# Patient Record
Sex: Male | Born: 1950 | Race: White | Hispanic: No | Marital: Single | Smoking: Current every day smoker
Health system: Southern US, Community
[De-identification: ages and names within clinical notes are randomized; demographics above are authoritative.]

## PROBLEM LIST (undated history)

## (undated) DIAGNOSIS — G20A1 Parkinson's disease without dyskinesia, without mention of fluctuations: Secondary | ICD-10-CM

## (undated) DIAGNOSIS — M1990 Unspecified osteoarthritis, unspecified site: Secondary | ICD-10-CM

## (undated) DIAGNOSIS — E7800 Pure hypercholesterolemia, unspecified: Secondary | ICD-10-CM

## (undated) DIAGNOSIS — F70 Mild intellectual disabilities: Secondary | ICD-10-CM

## (undated) DIAGNOSIS — K219 Gastro-esophageal reflux disease without esophagitis: Secondary | ICD-10-CM

## (undated) DIAGNOSIS — J449 Chronic obstructive pulmonary disease, unspecified: Secondary | ICD-10-CM

## (undated) DIAGNOSIS — I10 Essential (primary) hypertension: Secondary | ICD-10-CM

## (undated) DIAGNOSIS — G20 Parkinson's disease: Secondary | ICD-10-CM

## (undated) DIAGNOSIS — G43909 Migraine, unspecified, not intractable, without status migrainosus: Secondary | ICD-10-CM

## (undated) HISTORY — DX: Unspecified osteoarthritis, unspecified site: M19.90

## (undated) HISTORY — PX: APPENDECTOMY: SHX54

## (undated) HISTORY — DX: Chronic obstructive pulmonary disease, unspecified: J44.9

## (undated) HISTORY — DX: Migraine, unspecified, not intractable, without status migrainosus: G43.909

## (undated) HISTORY — DX: Gastro-esophageal reflux disease without esophagitis: K21.9

## (undated) HISTORY — DX: Essential (primary) hypertension: I10

## (undated) HISTORY — PX: VEIN LIGATION AND STRIPPING: SHX2653

## (undated) HISTORY — DX: Pure hypercholesterolemia, unspecified: E78.00

## (undated) HISTORY — DX: Mild intellectual disabilities: F70

## (undated) HISTORY — DX: Parkinson's disease: G20

## (undated) HISTORY — DX: Parkinson's disease without dyskinesia, without mention of fluctuations: G20.A1

---

## 1998-05-28 ENCOUNTER — Inpatient Hospital Stay (HOSPITAL_COMMUNITY): Admission: AD | Admit: 1998-05-28 | Discharge: 1998-05-30 | Payer: Self-pay | Admitting: *Deleted

## 1998-09-16 ENCOUNTER — Inpatient Hospital Stay (HOSPITAL_COMMUNITY): Admission: AD | Admit: 1998-09-16 | Discharge: 1998-09-19 | Payer: Self-pay | Admitting: *Deleted

## 1999-01-19 ENCOUNTER — Inpatient Hospital Stay (HOSPITAL_COMMUNITY): Admission: EM | Admit: 1999-01-19 | Discharge: 1999-01-23 | Payer: Self-pay | Admitting: *Deleted

## 1999-05-10 ENCOUNTER — Inpatient Hospital Stay (HOSPITAL_COMMUNITY): Admission: AD | Admit: 1999-05-10 | Discharge: 1999-05-20 | Payer: Self-pay

## 1999-08-07 ENCOUNTER — Emergency Department (HOSPITAL_COMMUNITY): Admission: EM | Admit: 1999-08-07 | Discharge: 1999-08-07 | Payer: Self-pay | Admitting: Emergency Medicine

## 2000-02-09 ENCOUNTER — Inpatient Hospital Stay (HOSPITAL_COMMUNITY): Admission: EM | Admit: 2000-02-09 | Discharge: 2000-02-12 | Payer: Self-pay | Admitting: *Deleted

## 2000-02-11 ENCOUNTER — Encounter: Payer: Self-pay | Admitting: *Deleted

## 2000-06-21 ENCOUNTER — Emergency Department (HOSPITAL_COMMUNITY): Admission: EM | Admit: 2000-06-21 | Discharge: 2000-06-21 | Payer: Self-pay | Admitting: Emergency Medicine

## 2000-06-28 ENCOUNTER — Emergency Department (HOSPITAL_COMMUNITY): Admission: EM | Admit: 2000-06-28 | Discharge: 2000-06-28 | Payer: Self-pay | Admitting: Emergency Medicine

## 2000-07-04 ENCOUNTER — Emergency Department (HOSPITAL_COMMUNITY): Admission: EM | Admit: 2000-07-04 | Discharge: 2000-07-04 | Payer: Self-pay | Admitting: Internal Medicine

## 2000-07-26 ENCOUNTER — Emergency Department (HOSPITAL_COMMUNITY): Admission: EM | Admit: 2000-07-26 | Discharge: 2000-07-26 | Payer: Self-pay | Admitting: Emergency Medicine

## 2000-08-05 ENCOUNTER — Emergency Department (HOSPITAL_COMMUNITY): Admission: EM | Admit: 2000-08-05 | Discharge: 2000-08-05 | Payer: Self-pay | Admitting: Internal Medicine

## 2000-09-23 ENCOUNTER — Emergency Department (HOSPITAL_COMMUNITY): Admission: EM | Admit: 2000-09-23 | Discharge: 2000-09-23 | Payer: Self-pay | Admitting: *Deleted

## 2000-09-30 ENCOUNTER — Encounter: Payer: Self-pay | Admitting: Emergency Medicine

## 2000-09-30 ENCOUNTER — Emergency Department (HOSPITAL_COMMUNITY): Admission: EM | Admit: 2000-09-30 | Discharge: 2000-09-30 | Payer: Self-pay | Admitting: Emergency Medicine

## 2000-11-01 ENCOUNTER — Emergency Department (HOSPITAL_COMMUNITY): Admission: EM | Admit: 2000-11-01 | Discharge: 2000-11-01 | Payer: Self-pay | Admitting: *Deleted

## 2000-11-02 ENCOUNTER — Emergency Department (HOSPITAL_COMMUNITY): Admission: EM | Admit: 2000-11-02 | Discharge: 2000-11-02 | Payer: Self-pay | Admitting: *Deleted

## 2000-12-20 ENCOUNTER — Emergency Department (HOSPITAL_COMMUNITY): Admission: EM | Admit: 2000-12-20 | Discharge: 2000-12-20 | Payer: Self-pay | Admitting: Internal Medicine

## 2001-02-19 ENCOUNTER — Emergency Department (HOSPITAL_COMMUNITY): Admission: EM | Admit: 2001-02-19 | Discharge: 2001-02-19 | Payer: Self-pay | Admitting: *Deleted

## 2001-02-23 ENCOUNTER — Emergency Department (HOSPITAL_COMMUNITY): Admission: EM | Admit: 2001-02-23 | Discharge: 2001-02-23 | Payer: Self-pay | Admitting: *Deleted

## 2001-02-23 ENCOUNTER — Encounter: Payer: Self-pay | Admitting: *Deleted

## 2001-03-16 ENCOUNTER — Emergency Department (HOSPITAL_COMMUNITY): Admission: EM | Admit: 2001-03-16 | Discharge: 2001-03-16 | Payer: Self-pay | Admitting: Emergency Medicine

## 2001-05-09 ENCOUNTER — Emergency Department (HOSPITAL_COMMUNITY): Admission: EM | Admit: 2001-05-09 | Discharge: 2001-05-09 | Payer: Self-pay | Admitting: *Deleted

## 2001-05-15 ENCOUNTER — Encounter: Payer: Self-pay | Admitting: *Deleted

## 2001-05-15 ENCOUNTER — Emergency Department (HOSPITAL_COMMUNITY): Admission: EM | Admit: 2001-05-15 | Discharge: 2001-05-15 | Payer: Self-pay | Admitting: *Deleted

## 2001-08-01 ENCOUNTER — Emergency Department (HOSPITAL_COMMUNITY): Admission: EM | Admit: 2001-08-01 | Discharge: 2001-08-01 | Payer: Self-pay | Admitting: Emergency Medicine

## 2001-09-21 ENCOUNTER — Emergency Department (HOSPITAL_COMMUNITY): Admission: EM | Admit: 2001-09-21 | Discharge: 2001-09-21 | Payer: Self-pay | Admitting: *Deleted

## 2001-10-05 ENCOUNTER — Emergency Department (HOSPITAL_COMMUNITY): Admission: EM | Admit: 2001-10-05 | Discharge: 2001-10-05 | Payer: Self-pay | Admitting: Emergency Medicine

## 2001-11-16 ENCOUNTER — Emergency Department (HOSPITAL_COMMUNITY): Admission: EM | Admit: 2001-11-16 | Discharge: 2001-11-16 | Payer: Self-pay | Admitting: Emergency Medicine

## 2002-01-05 ENCOUNTER — Encounter: Payer: Self-pay | Admitting: Emergency Medicine

## 2002-01-05 ENCOUNTER — Emergency Department (HOSPITAL_COMMUNITY): Admission: EM | Admit: 2002-01-05 | Discharge: 2002-01-05 | Payer: Self-pay | Admitting: Emergency Medicine

## 2002-02-15 ENCOUNTER — Emergency Department (HOSPITAL_COMMUNITY): Admission: EM | Admit: 2002-02-15 | Discharge: 2002-02-15 | Payer: Self-pay | Admitting: Emergency Medicine

## 2002-05-18 ENCOUNTER — Emergency Department (HOSPITAL_COMMUNITY): Admission: EM | Admit: 2002-05-18 | Discharge: 2002-05-18 | Payer: Self-pay | Admitting: *Deleted

## 2002-08-28 ENCOUNTER — Emergency Department (HOSPITAL_COMMUNITY): Admission: EM | Admit: 2002-08-28 | Discharge: 2002-08-28 | Payer: Self-pay | Admitting: Emergency Medicine

## 2002-11-13 ENCOUNTER — Emergency Department (HOSPITAL_COMMUNITY): Admission: EM | Admit: 2002-11-13 | Discharge: 2002-11-13 | Payer: Self-pay | Admitting: *Deleted

## 2002-12-07 ENCOUNTER — Emergency Department (HOSPITAL_COMMUNITY): Admission: EM | Admit: 2002-12-07 | Discharge: 2002-12-07 | Payer: Self-pay | Admitting: Internal Medicine

## 2003-03-15 ENCOUNTER — Emergency Department (HOSPITAL_COMMUNITY): Admission: EM | Admit: 2003-03-15 | Discharge: 2003-03-15 | Payer: Self-pay | Admitting: Emergency Medicine

## 2003-05-21 ENCOUNTER — Inpatient Hospital Stay (HOSPITAL_COMMUNITY): Admission: AD | Admit: 2003-05-21 | Discharge: 2003-05-23 | Payer: Self-pay | Admitting: Psychiatry

## 2003-05-21 ENCOUNTER — Emergency Department (HOSPITAL_COMMUNITY): Admission: EM | Admit: 2003-05-21 | Discharge: 2003-05-21 | Payer: Self-pay | Admitting: Emergency Medicine

## 2003-06-06 ENCOUNTER — Emergency Department (HOSPITAL_COMMUNITY): Admission: EM | Admit: 2003-06-06 | Discharge: 2003-06-06 | Payer: Self-pay | Admitting: Emergency Medicine

## 2003-06-10 ENCOUNTER — Emergency Department (HOSPITAL_COMMUNITY): Admission: EM | Admit: 2003-06-10 | Discharge: 2003-06-10 | Payer: Self-pay | Admitting: *Deleted

## 2003-08-13 ENCOUNTER — Emergency Department (HOSPITAL_COMMUNITY): Admission: EM | Admit: 2003-08-13 | Discharge: 2003-08-13 | Payer: Self-pay | Admitting: Emergency Medicine

## 2003-08-28 ENCOUNTER — Emergency Department (HOSPITAL_COMMUNITY): Admission: EM | Admit: 2003-08-28 | Discharge: 2003-08-28 | Payer: Self-pay | Admitting: Emergency Medicine

## 2003-10-10 ENCOUNTER — Emergency Department (HOSPITAL_COMMUNITY): Admission: EM | Admit: 2003-10-10 | Discharge: 2003-10-10 | Payer: Self-pay | Admitting: Emergency Medicine

## 2003-12-27 ENCOUNTER — Emergency Department (HOSPITAL_COMMUNITY): Admission: EM | Admit: 2003-12-27 | Discharge: 2003-12-27 | Payer: Self-pay | Admitting: Emergency Medicine

## 2004-04-07 ENCOUNTER — Emergency Department (HOSPITAL_COMMUNITY): Admission: EM | Admit: 2004-04-07 | Discharge: 2004-04-07 | Payer: Self-pay | Admitting: Emergency Medicine

## 2004-04-22 ENCOUNTER — Emergency Department (HOSPITAL_COMMUNITY): Admission: EM | Admit: 2004-04-22 | Discharge: 2004-04-22 | Payer: Self-pay | Admitting: Emergency Medicine

## 2004-07-24 ENCOUNTER — Emergency Department (HOSPITAL_COMMUNITY): Admission: EM | Admit: 2004-07-24 | Discharge: 2004-07-24 | Payer: Self-pay | Admitting: Emergency Medicine

## 2006-02-06 ENCOUNTER — Emergency Department (HOSPITAL_COMMUNITY): Admission: EM | Admit: 2006-02-06 | Discharge: 2006-02-07 | Payer: Self-pay | Admitting: Emergency Medicine

## 2006-08-05 ENCOUNTER — Emergency Department (HOSPITAL_COMMUNITY): Admission: EM | Admit: 2006-08-05 | Discharge: 2006-08-05 | Payer: Self-pay | Admitting: Emergency Medicine

## 2006-08-08 ENCOUNTER — Emergency Department (HOSPITAL_COMMUNITY): Admission: EM | Admit: 2006-08-08 | Discharge: 2006-08-09 | Payer: Self-pay | Admitting: Emergency Medicine

## 2007-02-11 ENCOUNTER — Emergency Department (HOSPITAL_COMMUNITY): Admission: EM | Admit: 2007-02-11 | Discharge: 2007-02-11 | Payer: Self-pay | Admitting: Emergency Medicine

## 2007-08-17 ENCOUNTER — Inpatient Hospital Stay (HOSPITAL_COMMUNITY): Admission: EM | Admit: 2007-08-17 | Discharge: 2007-08-19 | Payer: Self-pay | Admitting: Emergency Medicine

## 2007-11-01 ENCOUNTER — Emergency Department (HOSPITAL_COMMUNITY): Admission: EM | Admit: 2007-11-01 | Discharge: 2007-11-01 | Payer: Self-pay | Admitting: Emergency Medicine

## 2008-02-24 ENCOUNTER — Ambulatory Visit (HOSPITAL_COMMUNITY): Admission: RE | Admit: 2008-02-24 | Discharge: 2008-02-24 | Payer: Self-pay | Admitting: Family Medicine

## 2008-04-24 ENCOUNTER — Emergency Department (HOSPITAL_COMMUNITY): Admission: EM | Admit: 2008-04-24 | Discharge: 2008-04-24 | Payer: Self-pay | Admitting: Emergency Medicine

## 2008-12-28 ENCOUNTER — Emergency Department (HOSPITAL_COMMUNITY): Admission: EM | Admit: 2008-12-28 | Discharge: 2008-12-28 | Payer: Self-pay | Admitting: Emergency Medicine

## 2009-01-27 ENCOUNTER — Emergency Department (HOSPITAL_COMMUNITY): Admission: EM | Admit: 2009-01-27 | Discharge: 2009-01-27 | Payer: Self-pay | Admitting: Emergency Medicine

## 2009-08-01 ENCOUNTER — Emergency Department (HOSPITAL_COMMUNITY): Admission: EM | Admit: 2009-08-01 | Discharge: 2009-08-01 | Payer: Self-pay | Admitting: Emergency Medicine

## 2009-10-03 ENCOUNTER — Emergency Department (HOSPITAL_COMMUNITY): Admission: EM | Admit: 2009-10-03 | Discharge: 2009-10-04 | Payer: Self-pay | Admitting: Emergency Medicine

## 2009-11-02 ENCOUNTER — Emergency Department (HOSPITAL_COMMUNITY): Admission: EM | Admit: 2009-11-02 | Discharge: 2009-11-02 | Payer: Self-pay | Admitting: Emergency Medicine

## 2010-02-23 ENCOUNTER — Emergency Department (HOSPITAL_COMMUNITY)
Admission: EM | Admit: 2010-02-23 | Discharge: 2010-02-23 | Payer: Self-pay | Source: Home / Self Care | Admitting: Emergency Medicine

## 2010-02-23 LAB — POCT I-STAT, CHEM 8
Calcium, Ion: 1.15 mmol/L (ref 1.12–1.32)
Creatinine, Ser: 0.8 mg/dL (ref 0.4–1.5)
HCT: 45 % (ref 39.0–52.0)
Hemoglobin: 15.3 g/dL (ref 13.0–17.0)
Potassium: 4.6 mEq/L (ref 3.5–5.1)
Sodium: 138 mEq/L (ref 135–145)
TCO2: 25 mmol/L (ref 0–100)

## 2010-02-23 LAB — CBC
MCH: 34.3 pg — ABNORMAL HIGH (ref 26.0–34.0)
MCHC: 36.2 g/dL — ABNORMAL HIGH (ref 30.0–36.0)
MCV: 94.7 fL (ref 78.0–100.0)
Platelets: 192 10*3/uL (ref 150–400)

## 2010-02-23 LAB — DIFFERENTIAL
Basophils Relative: 1 % (ref 0–1)
Eosinophils Absolute: 0.2 10*3/uL (ref 0.0–0.7)
Eosinophils Relative: 2 % (ref 0–5)
Lymphocytes Relative: 19 % (ref 12–46)
Lymphs Abs: 1.6 10*3/uL (ref 0.7–4.0)
Monocytes Absolute: 0.7 10*3/uL (ref 0.1–1.0)
Monocytes Relative: 8 % (ref 3–12)
Neutro Abs: 6.1 10*3/uL (ref 1.7–7.7)

## 2010-02-23 LAB — POCT CARDIAC MARKERS: Myoglobin, poc: 134 ng/mL (ref 12–200)

## 2010-04-11 LAB — RAPID URINE DRUG SCREEN, HOSP PERFORMED
Barbiturates: NOT DETECTED
Cocaine: NOT DETECTED
Tetrahydrocannabinol: NOT DETECTED

## 2010-04-11 LAB — DIFFERENTIAL
Basophils Absolute: 0 10*3/uL (ref 0.0–0.1)
Basophils Absolute: 0.1 10*3/uL (ref 0.0–0.1)
Basophils Relative: 1 % (ref 0–1)
Eosinophils Relative: 2 % (ref 0–5)
Lymphocytes Relative: 23 % (ref 12–46)
Monocytes Absolute: 0.7 10*3/uL (ref 0.1–1.0)
Monocytes Relative: 8 % (ref 3–12)
Neutro Abs: 5.6 10*3/uL (ref 1.7–7.7)
Neutro Abs: 7.4 10*3/uL (ref 1.7–7.7)
Neutrophils Relative %: 66 % (ref 43–77)

## 2010-04-11 LAB — GLUCOSE, CAPILLARY
Glucose-Capillary: 210 mg/dL — ABNORMAL HIGH (ref 70–99)
Glucose-Capillary: 233 mg/dL — ABNORMAL HIGH (ref 70–99)
Glucose-Capillary: 285 mg/dL — ABNORMAL HIGH (ref 70–99)

## 2010-04-11 LAB — COMPREHENSIVE METABOLIC PANEL
ALT: 20 U/L (ref 0–53)
AST: 20 U/L (ref 0–37)
Albumin: 4 g/dL (ref 3.5–5.2)
Alkaline Phosphatase: 93 U/L (ref 39–117)
Chloride: 104 mEq/L (ref 96–112)
GFR calc Af Amer: >60 mL/min (ref >60)
Potassium: 4.5 mEq/L (ref 3.5–5.1)
Sodium: 135 mEq/L (ref 135–145)
Total Bilirubin: 0.7 mg/dL (ref 0.3–1.2)
Total Protein: 7.3 g/dL (ref 6.0–8.3)

## 2010-04-11 LAB — CBC
HCT: 42.8 % (ref 39.0–52.0)
HCT: 46.2 % (ref 39.0–52.0)
Hemoglobin: 15.8 g/dL (ref 13.0–17.0)
MCV: 97.7 fL (ref 78.0–100.0)
Platelets: 221 10*3/uL (ref 150–400)
RDW: 12.8 % (ref 11.5–15.5)
RDW: 13.2 % (ref 11.5–15.5)
WBC: 8.5 10*3/uL (ref 4.0–10.5)
WBC: 9.4 10*3/uL (ref 4.0–10.5)

## 2010-04-11 LAB — URINALYSIS, ROUTINE W REFLEX MICROSCOPIC
Glucose, UA: 100 mg/dL — AB
Hgb urine dipstick: NEGATIVE
Hgb urine dipstick: NEGATIVE
Leukocytes, UA: NEGATIVE
Protein, ur: NEGATIVE mg/dL
Protein, ur: NEGATIVE mg/dL
Specific Gravity, Urine: 1.015 (ref 1.005–1.030)
Urobilinogen, UA: 0.2 mg/dL (ref 0.0–1.0)
pH: 7 (ref 5.0–8.0)

## 2010-04-11 LAB — BASIC METABOLIC PANEL
BUN: 10 mg/dL (ref 6–23)
Creatinine, Ser: 0.7 mg/dL (ref 0.4–1.5)
GFR calc non Af Amer: >60 mL/min (ref >60)
Potassium: 3.9 mEq/L (ref 3.5–5.1)

## 2010-04-11 LAB — CK TOTAL AND CKMB (NOT AT ARMC)
CK, MB: 12.6 ng/mL (ref 0.3–4.0)
Relative Index: 5 — ABNORMAL HIGH (ref 0.0–2.5)

## 2010-04-11 LAB — URINE MICROSCOPIC-ADD ON

## 2010-04-11 LAB — APTT: aPTT: 27 seconds (ref 24–37)

## 2010-04-11 LAB — ETHANOL: Alcohol, Ethyl (B): <5 mg/dL (ref 0–10)

## 2010-04-14 LAB — COMPREHENSIVE METABOLIC PANEL
AST: 19 U/L (ref 0–37)
Albumin: 3.7 g/dL (ref 3.5–5.2)
BUN: 13 mg/dL (ref 6–23)
Calcium: 9.1 mg/dL (ref 8.4–10.5)
Creatinine, Ser: 0.8 mg/dL (ref 0.4–1.5)
GFR calc Af Amer: >60 mL/min (ref >60)
GFR calc non Af Amer: >60 mL/min (ref >60)

## 2010-04-14 LAB — DIFFERENTIAL
Eosinophils Relative: 1 % (ref 0–5)
Lymphocytes Relative: 13 % (ref 12–46)
Lymphs Abs: 1.3 10*3/uL (ref 0.7–4.0)
Monocytes Absolute: 0.7 10*3/uL (ref 0.1–1.0)
Neutro Abs: 7.3 10*3/uL (ref 1.7–7.7)

## 2010-04-14 LAB — URINALYSIS, ROUTINE W REFLEX MICROSCOPIC
Glucose, UA: >1000 mg/dL — AB
Ketones, ur: NEGATIVE mg/dL
Leukocytes, UA: NEGATIVE
Nitrite: NEGATIVE
Specific Gravity, Urine: 1.015 (ref 1.005–1.030)
pH: 6 (ref 5.0–8.0)

## 2010-04-14 LAB — CBC
MCH: 33.6 pg (ref 26.0–34.0)
MCHC: 35 g/dL (ref 30.0–36.0)
Platelets: 206 10*3/uL (ref 150–400)

## 2010-04-14 LAB — GLUCOSE, CAPILLARY: Glucose-Capillary: 252 mg/dL — ABNORMAL HIGH (ref 70–99)

## 2010-04-30 LAB — URINALYSIS, ROUTINE W REFLEX MICROSCOPIC
Hgb urine dipstick: NEGATIVE
Protein, ur: NEGATIVE mg/dL
Specific Gravity, Urine: 1.01 (ref 1.005–1.030)
Urobilinogen, UA: 0.2 mg/dL (ref 0.0–1.0)

## 2010-05-09 LAB — DIFFERENTIAL
Basophils Absolute: 0 10*3/uL (ref 0.0–0.1)
Basophils Relative: 0 % (ref 0–1)
Eosinophils Relative: 1 % (ref 0–5)
Lymphocytes Relative: 12 % (ref 12–46)
Monocytes Absolute: 0.7 10*3/uL (ref 0.1–1.0)

## 2010-05-09 LAB — CBC
HCT: 45.7 % (ref 39.0–52.0)
Hemoglobin: 16.2 g/dL (ref 13.0–17.0)
MCHC: 35.4 g/dL (ref 30.0–36.0)
RDW: 12.9 % (ref 11.5–15.5)

## 2010-05-09 LAB — BASIC METABOLIC PANEL
CO2: 28 mEq/L (ref 19–32)
Chloride: 101 mEq/L (ref 96–112)
Glucose, Bld: 193 mg/dL — ABNORMAL HIGH (ref 70–99)
Potassium: 4.3 mEq/L (ref 3.5–5.1)
Sodium: 133 mEq/L — ABNORMAL LOW (ref 135–145)

## 2010-06-11 NOTE — Discharge Summary (Signed)
Gerald Cruz, Gerald Cruz                 ACCOUNT NO.:  [id number]   MEDICAL RECORD NO.:  [id number]          PATIENT TYPE:  INP   LOCATION:  A327                          FACILITY:  [facility]   PHYSICIAN:  Scott A. Gerda Diss, MD    DATE OF BIRTH:  1950-03-11   DATE OF ADMISSION:  08/17/2007  DATE OF DISCHARGE:  07/23/2009LH                               DISCHARGE SUMMARY   DISCHARGE DIAGNOSIS:  Syncope.   The patient states passing out spells.  Over the past few weeks, he has  had some syncopal episodes.  He does not check his sugars much.  His A1c  recently had been 5.9, which indicates possibility of too tight control.  Given his mental health limitations, we will back-off on some of his  medication and at this point in time I have recommended the glyburide be  stopped.  In addition to this, we would decrease the Actos at 30 mg each  morning.  His cardiac enzymes while in the hospital, it should be noted  that initial cardiac panel on August 17, 2007, was normal and then on August 17, 2007, later that day the MB was 9.3, creatinine kinase 235, and  troponin was 0.01, and then the following day his MB was 11.4, but  troponin was less than 0.01.  Also, it should be noted that Cardiology  consultation was asked for but then due to some sort of glitch it did  not get completed in.  It should also be noted that he had EKG on August 18, 2007, which showed no acute changes and the patient has not had any  chest pains or passing out spells since being in the hospital and he  quite emphatically demands to go home and states he is going to sign out  AMA if we do not release him right now and there was no reasoning with  him.  I felt he was capable of making this decision, yet also it would  probably be best if he was able to stay.  On the chest exam, he does  have a little bit of chest congestion.  We are going to give him a dose  of antibiotics, but then we are also going to recommend that he follow  up in  the office early next week and also discharge him to home.   HOME MEDICATIONS:  1. Haloperidol 10 mg daily.  2. Pindolol 5 mg b.i.d.  3. Lithium 300 mg t.i.d.  4. Trazodone 100 mg daily.  5. Benztropine 1 mg b.i.d.  6. Abilify 15 mg daily.  7. Lovastatin 20 mg daily.  8. Gemfibrozil 600 mg b.i.d.  9. Metformin 850 mg b.i.d.  10.Actos 30 mg p.o. daily.  11.Lantus 10 units at bedtime.  12.Nizatidine 150 mg daily.  13.Mentax 1 tablet daily.  14.Levaquin 500 mg daily for 7 days.   Follow up with Dr. Brett Canales on Monday on August 23, 2007, and follow up  sooner if any particular problems to report back to the ED and to eat  all meals and not to skip meals.  I think the syncope was related to  hypoglycemia.  I really doubt heart disease.  He certainly has a lot of  risk factors, although he is not willing to stay or go through any  further tests.      Scott A. Gerda Diss, MD  Electronically Signed     SAL/MEDQ  D:  08/19/2007  T:  08/19/2007  Job:  [id number]

## 2010-06-11 NOTE — H&P (Signed)
Gerald Cruz, CINA                 ACCOUNT NO.:  [id number]   MEDICAL RECORD NO.:  [id number]          PATIENT TYPE:  INP   LOCATION:  A327                          FACILITY:  [facility]   PHYSICIAN:  Donna Bernard, M.D.DATE OF BIRTH:  1950-06-14   DATE OF ADMISSION:  08/17/2007  DATE OF DISCHARGE:  LH                              HISTORY & PHYSICAL   CHIEF COMPLAINT:  Passing out spells and chest spasms.   SUBJECTIVE:  This patient is a 60 year old white male with history of  type 2 diabetes now insulin-requiring, hyperlipidemia, chronic smoking,  asthma and chronic psychiatric difficulties including A prior diagnosis  of schizophrenia and potential mental retardation, who presents to the  hospital the day of admission with acute concerns.  On further history  late this morning, the patient was found sweaty and a bit short of  breath.  He passed out.  He claims no significant injury from having  fallen.  On further history, off and on for the past few weeks he has  had several other syncopal episodes.  The patient does not really check  his sugars very much.  They are checked on occasion at the home where he  stays.  These were unavailable.  The patient also notes chest spasm  intermittently he describes as an achiness, comes, lasts a few minutes  and then fades away.  There is strong family history coronary artery  disease on his mother's side.   The patient claims compliance with his medications, which include:  1. Metformin 850 mg one p.o. b.i.d.  2. Lopid 600 mg one p.o. b.i.d.  3. Benztropine 1 mg b.i.d.  4. Trazodone 100 mg p.o. q.h.s.  5. Lithium carbonate 300 mg t.i.d.  6. Pindolol 5 mg b.i.d.  7. Haldol 10 mg q.h.s.  8. Abilify 15 mg q.a.m.  9. Actos 45 mg each morning.  10.Mevacor 20 mg p.o. q.h.s.  11.Glyburide which is administered 2.5 q.i.d. at his home.  12.Prilosec 20 mg daily.  13.Lantus 10 units q.h.s.   PAST SURGERIES:  Remote vein stripping.  The patient  has declined  colonoscopy the past.   SOCIAL:  Patient is single, lives in group home.  Smokes a couple packs  per day.  No alcohol use.  No children.   FAMILY HISTORY:  Positive for coronary artery disease as noted and also  diabetes and hypertension.   REVIEW OF SYSTEMS:  Otherwise negative.   PHYSICAL EXAM:  BP 138/86.  Patient is alert, no acute distress.  Significant obesity noted.  HEENT: Normal.  LUNGS:  Rare wheeze bilaterally.  HEART:  Normal rate and rhythm.  No significant murmurs.  ABDOMEN:  Nontender.  EXTREMITIES:  Trace edema, ankles.  No significant rash.   SIGNIFICANT LABS:  EKG:  Normal sinus rhythm.  No significant ST-T  changes.  Cardiac enzymes negative. Met-7, CBC good mild elevation of  glucose, currently 140.   IMPRESSION:  1. Multiple syncopal events, likely hypoglycemia with the patient's      spotty intake at times, the fact he does not control his own  sugars.  He also has schizophrenia history with an element of      mental retardation, which makes him less likely to listen to      signals of hypoglycemia.  I am sure he skips meals.  He is well-      meaning but this is a real problem.  2. Chest spasm, nonspecific; however, with intermittent syncopal      events along with many other risk factors need to consider possible      cardiac etiology.  3. Type 2 diabetes.  A1c really too tight in light of most recent      circumstances.  A1c was 5.6 back in the spring.  Going to hold off      on glyburide since it is a frequent culprit in hypoglycemia.  4. Hyperlipidemia.  5. Chronic smoking.  6. Asthma.  7. Hypertension, stable.   PLAN:  As per orders.      Donna Bernard, M.D.  Electronically Signed     WSL/MEDQ  D:  08/17/2007  T:  08/18/2007  Job:  [id number]

## 2010-06-14 NOTE — Op Note (Signed)
NAMECHERON, Cruz                 ACCOUNT NO.:  [id number]   MEDICAL RECORD NO.:  [id number]          PATIENT TYPE:  AMB   LOCATION:  DAY                           FACILITY:  [facility]   PHYSICIAN:  R. Roetta Sessions, M.D. DATE OF BIRTH:  [birth year]   DATE OF PROCEDURE:  04/03/2004  DATE OF DISCHARGE:                                 OPERATIVE REPORT   PROCEDURE PERFORMED:  Colonoscopy.   INDICATIONS FOR PROCEDURE:  [name] is a 60 year old gentleman sent around  through courtesy of Dr. Lilyan Punt for colon cancer screening via  colonoscopy.   He was seen in the outpatient holding area. He was quite agitated and irate  and stated he was not going to get a needle and was not going to have the  procedure.  He told me he had rights and was not interested in having a  colonoscopy for any reason under any circumstances.  He put his clothes back  on and left the hospital.      RMR/MEDQ  D:  04/03/2004  T:  04/03/2004  Job:  [id number]   cc:   Lorin Picket A. Gerda Diss, MD  [address]  [place]  [place] [address]  Fax: [phone]

## 2010-06-14 NOTE — Discharge Summary (Signed)
[facility]  Patient:    Gerald Cruz, Gerald Cruz                        MRN: [id number] Adm. Date:  [id number] Disc. Date: [id number] Attending:  Jasmine Pang CC:         [facility]  Attn: M. Pickett   Discharge Summary  REASON FOR ADMISSION:  Patient was a 60 year old Caucasian male from [place], [place] with a history of paranoid schizophrenia.  He had reportedly begun to hallucinate saying he was seeing dogs threatening him and devils coming out of the wall.  He became agitated when he was sent to the emergency room and was hostile and paranoid.  He has also had an altercation with a neighbor who states she is fearful of him.  For further admission information, please see psychiatric admission assessment.  PHYSICAL EXAMINATION:  Patient suffers from obesity, asthma, hypertension, and diabetes.  He also had severe varicosities.  There was some abdominal tenderness on exam also.  ADMISSION LABORATORIES:  CBC with differential was grossly within normal limits except for a slightly decreased potassium of 3.4 (3.5 to 5), glucose was slightly elevated at 125 (70-115).  He is on Glucophage to treat his diabetes.  Glycosylated hemoglobin was 5.3 (4.6 to 6.5).  Hypothyroid profile was clinically within normal limits.  Morning lithium level was 0.54 (0.8 to 1.4).  HOSPITAL COURSE:  Patient was continued on his home medication including albuterol inhaler two puffs q.i.d. p.r.n. shortness of breath due to his asthma, Haldol 10 mg q.h.s., pindolol 5 mg p.o. q.d. for hypertension, lithium 300 mg p.o. t.i.d., trazodone 100 mg p.o. q.h.s., Cogentin 1 mg p.o. b.i.d. and Glucophage 500 mg p.o. q.d. for diabetes.  Patient states primarily for a crisis admission.  He stabilized quickly and became less psychotic.  He was not agitated or violent.  There were no changes made in his medications since his case manager at the [facility] states he has done well on these.  He has a support system with his case Production designer, theatre/television/film and other assistance from the [facility].  They provided transportation for him to return home at the time of discharge.  DISCHARGE MENTAL STATUS EXAMINATION:  The patients mental status had improved.  He had fairly good eye contact.  His mood and affect were bright and positive.  He was less depressed and anxious and not irritable.  There were no suicidal or homicidal ideations.  There was no aggressive tendencies. He stated he did not intend to hurt anyone including his neighbor.  The mental health center was aware of the conflict he has had with his neighbor and will help with the situation.  He was not psychotic.  There were no auditory or visual hallucinations or delusions.  Thought processes were fairly logical and goal directed.  Cognitive exam was back to baseline.  DISCHARGE DIAGNOSES: Axis I:     1. Paranoid schizophrenia, chronic.             2. History of polysubstance abuse. Axis II:    None. Axis III:   1. Diabetes.             2. Hypertension.             3. Asthma.             4. Obesity.  5. Severe varicosities. Axis IV:    Severe. Axis V:     Global Assessment of Functioning current is 50; at admission,             Global Assessment of Functioning was 30.  DISCHARGE MEDICATIONS: 1. Haldol 10 mg p.o. q.h.s. 2. Lithium carbonate 300 mg p.o. t.i.d. 3. Trazodone 100 mg at bedtime. 4. Cogentin 1 mg b.i.d. 5. Pindolol 5 mg q.d. 6. Glucophage 500 mg p.o. q.d. 7. Albuterol inhaler p.r.n. as per the physician who is managing his asthma.  ACTIVITY LEVEL:  No major restrictions.  DIET:  To follow the ADA Diabetic Diet as described by his diabetes physician.  POSTHOSPITAL CARE PLAN:  Patient will continue to be seen at the [facility].  He has an appointment scheduled for soon after discharge.   Contact was maintained with his case manager during his hospitalization and they felt comfortable with him being discharged at this point and returning to outpatient treatment.  CONDITION ON DISCHARGE:  Improved.  PROGNOSIS:  Guarded. DD:  02/23/00 TD:  02/23/00 Job: [id number]

## 2010-06-14 NOTE — Discharge Summary (Signed)
Gerald Cruz, Gerald Cruz                           ACCOUNT NO.:  [id number]   MEDICAL RECORD NO.:  [id number]                   PATIENT TYPE:  IPS   LOCATION:  0404                                 FACILITY:  [facility]   PHYSICIAN:  Jeanice Lim, M.D.              DATE OF BIRTH:  [birth year]   DATE OF ADMISSION:  05/21/2003  DATE OF DISCHARGE:                                 DISCHARGE SUMMARY   IDENTIFYING INFORMATION:  This is a 60 year old white male who is single.  This was a voluntary admission.   HISTORY OF PRESENT ILLNESS:  This 60 year old white male with a history of  mental retardation and schizophrenia, paranoid type, presented in the  emergency department with thoughts of wanting to kill his girlfriend.  He  says that he loves her and today she called him over to where she was.  He  went over there and found her in bed with another man.  He says that he had  been teasing him that he could not have sex and that she needed to have sex  frequently.  When she got him there, she also told him that he was dumb.  He  feels embarrassed and angry.  He says that he loves her.  He went to the  emergency room because he felt upset and felt that he could not calm himself  down.  He was coherent in the emergency room, although he was upset.  He was  not acting out.  It is not clear whether he has been having auditory  hallucinations.  He apparently is compliant with his medications.  He is a  poor historian, unable to give much psychiatric or medical history.  His  speech is pressured.  He is agitated that he is here and is insistent about  leaving, although he admits that he has had thoughts of wanting to harm his  girlfriend and possibly harm himself because he feels so upset about the  incident.   PAST PSYCHIATRIC HISTORY:  The patient is followed at [facility].  This is his third admission to the [facility] with his last one being in 2002.   He has a history of  schizophrenia, paranoid-type, and mental retardation.  He has been followed  by his case manager and lives independently with case management support.  He has a history of previous admissions to [facility] and  actually says that he was in [facility] just last week.   SOCIAL HISTORY:  This is a single white male who lives alone in his own  apartment with support of his county mental health case Production designer, theatre/television/film.  Never  married.  No children.  He apparently does have some distant history in our  records of some substance abuse involving alcohol and cocaine, but his urine  drug screen was negative.  History taken  in the ER denies any current  substance abuse.  The patient does smoke approximately four packs per day of  cigarettes.   PAST MEDICAL HISTORY:  The patient's primary care Ande Therrell is not clear.  Medical problems are:  1. Diabetes mellitus.  2. Hypertension.  3. Rule out neuroleptic-induced dyskinesia.  4. Dyslipidemia.  5. GERD.   MEDICATIONS:  Medications were clarified with his pharmacy, which is  [place] at [phone].  They supply him with regular dose pack  dispensing.  Medications are:  1. Abilify 15 mg daily.  2. Actos 45 mg daily.  3. Cogentin 1 mg p.o. b.i.d.  4. Pindolol 5 mg b.i.d.  5. Haldol 10 mg p.o. q.h.s.  6. Lopid 600 mg b.i.d.  7. Glucophage 850 mg b.i.d.  8. Axid 150 mg daily.  9. Trazodone 100 mg p.o. q.h.s.  10.      Lithium 300 mg t.i.d.   DRUG ALLERGIES:  None.   POSITIVE PHYSICAL FINDINGS:  The patient's full physical exam was done in  the [facility] Emergency Room and is noted in the record.  Today he is  unwilling to cooperate with any further physical exam, although gross  findings find that his vital signs are basically within normal limits.  He  was afebrile with temperature 97.2 degrees.  His pulse was 100 at the time  of admission.  Blood pressure 130/87.  His respiratory rate was 20.   He  weight 240 pounds.  His CBG at the time of admission was 236.  That was this  morning CBG.  We were not able to get any idea of his medications until we  could call [facility] this morning.  He is now back on his regular  medications and his pharmacy indicates that he has been compliant.  He is in  no acute physical distress.  He denies any physical complaints other than  nicotine withdrawal.   MENTAL STATUS EXAMINATION:  This is a fully alert male who is agitated,  insistent that he is not willing to cooperate here.  He has been insisting  that he has to either go home or he is going to hurt the staff and take some  people out with him.  Speech is pressured and rapid.  He is hostile and  uncooperative.  He continues to perseverate about his girlfriend, although  from time to time he states he will not hurt her.  He continues to  perseverate on the incident and is clearly upset by it and admits that he  needs some time out.  His mood is agitated and angry.  Speech is pressured  and rapid.  Thought process is agitated.  Positive for suicidal thought  without a clear plan, but feeling hopeless, angry, and hurt over the  incident with his girlfriend.  Positive homicidal thoughts towards the  girlfriend since shamed him by calling him dumb.  Cognitively he is intact  and oriented x 3.  He is cognitively limited, but memory appears intact.  Impulse, control, and judgment impaired.  Insight fair.   LABORATORIES:  The patient's CBC is within normal limits with WBC 9.9,  hemoglobin 15.3, hematocrit 43.8, MCV 95.5, and platelets 292,000.  Routine  chemistries reveal normal electrolytes with a potassium of 3.7.  Glucose was  elevated at 252 as is already noted.  That was on yesterday's admission  laboratories.  BUN 5, creatinine 0.8.  The urine drug screen is negative for  all substances.  His  alcohol level was negative.  The lithium level and TSH are currently pending.   AXIS I:   1. Schizophrenia, paranoid type.  2. Distant history of polysubstance abuse.   AXIS II:  Mental retardation.   AXIS III:  1. Diabetes mellitus, type 2.  2. Hypertension.  3. Dyslipidemia.  4. Rule out neuroleptic-induced __________  5. Gastrointestinal reflux disease.   AXIS IV:  Severe conflict with his girlfriend and social stress.   AXIS V:  Global Assessment of Functioning:  Current 20, past year 50-55.   PLAN:  Voluntarily admit the patient with 15-minute checks in place with a  goal of alleviating his suicidal and homicidal thoughts.  We are going to  check a lithium level on him.  We have already obtained his medications from  the pharmacy.  This morning we are giving him Haldol 10 mg now and q.h.s. as  per his usual routine and will make available Haldol 10 mg IM or p.o. q.6h.  p.r.n. for agitation.  Ativan 2 mg IM or p.o. q.6h. p.r.n. for agitation.  We will resume his other medications per his routine.  We are planning on  transferring him to [facility] and are requesting admission  there.  Note that we have started this patient of 4 mg Nicorette gum to  alleviate his nicotine withdrawal and he has been variably compliant with  that, at times willing to do it and at other times not.   The patient is discharge with plan to transfer to [facility].     Claris Che A. Stephannie Peters                   Jeanice Lim, M.D.    MAS/MEDQ  D:  05/22/2003  T:  05/22/2003  Job:  [id number]

## 2010-06-14 NOTE — Discharge Summary (Signed)
[facility]  Patient:    Gerald Cruz, Gerald Cruz                      MRN: [id number] Adm. Date:  [id number] Disc. Date: [id number] Attending:  Esau Grew Dictator:   Valinda Hoar, N.P.                           Discharge Summary  HISTORY OF PRESENT ILLNESS:  [name] is a 60 year old white single male admitted on involuntary basis on May 10, 1999 because he says he was afraid he would hurt himself.  The patient denies everything on the involuntary commitment.  He says he did not say any of these things and he does not really know why he is here.  According to the certificate from [facility] patient said that he was hearing voices and he was afraid that he would harm himself.  He also stated that he thought people were talking about him and he was hearing voices telling him that everyone hates him and that he is no good. 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 This happened when he was 60 years old.  Patient states he is sleeping well, although the chart says he has been sleeping poorly.  Patient states he has not been eating in an effort to lose weight.  He has apparently not eaten in 2-3 days according to the patient.  Appetite decreased and he has had some weight loss.  He has decreased interest, increased energy with some paranoia and obviously some auditory hallucinations.  The patient has been to [facility] according to him "hundreds of times," [facility] x 2, [place], [place] x 2-3 times, [facility] multiple times.  He is followed by [facility] who is his legal guardian.  He states he is trying to change this because he does not like them and he feels like they are always doing something to him. Patients primary care doctor is Dr. Gerda Diss in [place], [place].  MEDICAL PROBLEMS:  Medical problems include diabetes mellitus, COPD.  He is on an 1800-calorie, ADA diet for his diabetes.  ADMISSION MEDICATIONS: 1. Glucophage 1200 mg q.d. 2. _________ 300 mg p.o. b.i.d. 3. Haldol 10 mg q.d. 4. Cogentin 1 mg q.d. 5. Albuterol inhaler two puffs q.i.d. p.r.n.  ALLERGIES:  No known drug allergies.  PHYSICAL EXAMINATION:  Please see physical exam done at [facility] on May 10, 1999.  According to physician at [facility], he was medically stable.  LABORATORY:  While he was here in the hospital, we did do a T4, T3, and TSH which were all within normal limits.  We did Lithium levels on April 16.  It was 0.40 which is low.  On April 20 it was low at 0.55.  We did a drug screen on him also and it was positive for tricylates, specifically positive for Trazodone.  Otherwise it was within normal limits.  His urinalysis was within normal limits.  HOSPITAL COURSE:  The patient was admitted to the [facility] unit and was started back on his 1800-calorie, ADA diet.  He was given Glucophage 500 mg q.d., Lithium 300 mg p.o. b.i.d., Haldol 10 mg p.o. q.d., Cogentin 1 mg p.o. q.d., albuterol inhaler two puffs  q.i.d. p.r.n.  On May 12, 1999, Dr. Janann August did order a Lithium level and increased his Risperdal to 2 mg at h.s.  While in the hospital he still complained of depression, blaming his uncle, but he cannot describe it.  It is a man.  He acknowledges with a plan but denies now.  Decreased sleep, decreased appetite, anxious, perseverates. He does go to the mental health center, and the [facility] is guardian and he does not feel like they take care of him.  They keep him depressed according to the patient.  He is paranoid delusional, hallucinating, thinking someone was going to hurt him.  He had been on Lithium and Haldol and then we decided to start him on the Risperdal.  He cannot recall being on the  Risperdal.  On the 16th he had increased agitation and anxiety.  He states he was frightened and afraid to be around people, having auditory hallucinations.  He states he does not want to say what they are saying, acknowledges that they are yelling, telling him to kill himself. Patient presents with some leg movements, drooling, no _________ movements noted.  He still feels like demons are coming out of the wall.  Patient contracts for safety.  Will consult with admitting M.D. regarding neuroleptic. Ativan 2 mg p.o. now for his agitation.  He was seen on the 17th by Dr. Janann August. He reported improvement this morning, brighter mood, decrease in hallucinations, less drooling, still anxious, restless, intrusive, and loud. He was tolerating his meds well.  We decided to increase his Risperdal, decrease his Haldol.  Lithium level was 0.4 and his Lithium was decreased and we changed this to Eskalith.  On the 18th he was seeing no hallucinations.  He reports decreased anxiety, charted five hours of sleep.  Patient reports feeling less frightened.  Speech was somewhat rapid but was relevant.  He was still intrusive at times, mood labile, appears to have increased energy, question of hypomania.  He continues with current treatment, 15 minute checks for safety.  On the 19th he was labile.  He became angry, threatening last evening.  He says he was just joking.  "This has been the best hospital I have ever been in."  He was sleeping and eating well.  Judgment is poor.  The Risperdal was increased along with the Haldol being decreased and we are going to recheck his Lithium level.  On the 20th patient was saying that he focused on not wanting mental health abuse guardian.  He denied auditory hallucinations.  He was anxious and afraid because of being discharged back to [facility].  He continued to be labile, states he is not suicidal or depressed, but states he will be if he is discharged.  Patient  appears agitated and somewhat argumentative.  On the 21st he was somewhat groggy, but again there were no suicidal ideations, no auditory or visual hallucinations.  Risperdal was decreased.  On the 22nd he had continued to do well, although he complained of increased station.  He did have an auditory hallucination telling him bad things, but he denied visual hallucinations.  On the 23rd generally he was more stable.  His mood was less labile.  He was less intrusive.  He denies current auditory hallucinations.  He slept well, feels alert today, no suicidal ideations, tolerated event well and it was decided that he could be managed on an outpatient basis in a safe manner.  CONDITION ON DISCHARGE:  The patient  is discharged in improved condition with an improvement in his mood, sleep, appetite, alleviation of suicidal or homicidal ideation, slightly sedated but he has shown some improvement in his energy.  DISPOSITION:  The patient is discharged to home and his leave is already in his [facility].  DISCHARGE MEDICATIONS: 1. Eskalith CR 450 one tab a.m., 1-1/2 at bedtime. 2. Risperdal 2 mg two tabs at bedtime. 3. Haldol 1 mg three halves at bedtime. 4. Glucophage 500 mg one tab daily. 5. Albuterol inhaler two puffs four times a day. 6. Cogentin 1 mg one tab q.a.m. 7. An antifungal powder apply to feet two times a day.  FINAL DIAGNOSES:   AXIS I:  _________ , undifferentiated type.  AXIS II:  Borderline mental retardation. AXIS III:  1. Diabetes mellitus type 2.            2. Chronic obstructive pulmonary disease.  AXIS IV:  Moderate related to problems with permanent support group and            social environment and literacy.   AXIS V:  Current global assessment of functioning 50, highest in past            year 60. DD:  06/06/99 TD:  06/10/99 Job: [id number]

## 2010-06-14 NOTE — Discharge Summary (Signed)
Gerald Cruz, Gerald Cruz                           ACCOUNT NO.:  [id number]   MEDICAL RECORD NO.:  [id number]                   PATIENT TYPE:  IPS   LOCATION:  0404                                 [facility]ACILITY[facility]   PHYSICIAN:  Jeanice Lim, M.D.              DATE OF BIRTH:  07-20-1950   DATE OF ADMISSION:  05/21/2003  DATE OF DISCHARGE:  05/23/2003                                 DISCHARGE SUMMARY   IDENTIFYING DATA:  This is a 60 year old Caucasian male, single, voluntarily  admitted with a history of MR and schizophrenia.  He presented to the  emergency room with thoughts of wanting to kill his girlfriend.  The patient  had found her in bed with another man.  She told him that she was dumb and  he was upset and felt that he could not calm down.  He had questionable  auditory hallucinations.   MEDICATIONS:  1. Abilify.  2. Actos.  3. Cogentin.  4. Pindolol.  5. Haldol.  6. Lopid.  7. Glucophage.  8. Trazodone.  9. Lithium.  10.      Axid.   DRUG ALLERGIES:  No known drug allergies.   PHYSICAL EXAMINATION:  GENERAL:  Within normal limits.  NEUROLOGIC:  Nonfocal.   LABORATORY DATA:  Routine admission labs:  Within normal limits.   MENTAL STATUS EXAM:  Fully alert, agitated, insistent that he would not  cooperate here, wanted to smoke.  He felt that he would prefer to go to [facility].  Speech was pressured, rapid.  He was agitated, angry,  perseverating on girlfriend, clearly paranoid, threatening.  He reported  suicidal ideation and homicidal ideation toward girlfriend, ashamed of being  dumb.  Cognitive: Grossly intact; borderline cognitive abilities.  Judgment and insight were quite poor.  Impulse control was also poor.   ADMISSION DIAGNOSES:   AXIS I:  Schizophrenia, paranoid type.   AXIS II:  Mental retardation.   AXIS III:  1. Diabetes mellitus type 2.  2. Hypertension.  3. Dyslipidemia.   AXIS IV:  Severe conflict with girlfriend and limited  support system.   AXIS V:  20/50-55   HOSPITAL COURSE:  The patient was admitted, ordered routine p.r.n.  medications, underwent further monitoring, and was encouraged to participate  in individual, group, and milieu therapy if tolerated.  He was placed on the  [address] Hall, placed on safety checks due to agitation.  He required 10 mg of  Haldol and Ativan IM upon admission due to threatening behavior, agitation.  The patient remained threatening, agitated, paranoid, delusional, difficult  to redirect, threatened staff as well as other patients.  [facility] was contacted to find appropriate placement for further  stabilization of this patient who could not be safely maintained within the  milieu due to acuity and mix of patients.  Therefore, the patient was  discharged with unchanged condition, still  agitated, threatening but willing  to cooperate with sheriff for transport for further stabilization in an  appropriate setting.  They were to continue medications on [facility] and arrange  followup prior to discharge after there were no safety issues.   DISCHARGE DIAGNOSES:   AXIS I:  Schizophrenia, paranoid type.   AXIS II:  Mental retardation.   AXIS III:  1. Diabetes mellitus type 2.  2. Hypertension.  3. Dyslipidemia.   AXIS IV:  Severe conflict with girlfriend and limited support system.   AXIS V:  20/50-55                                               Jeanice Lim, M.D.    JEM/MEDQ  D:  06/14/2003  T:  06/16/2003  Job:  [id number]

## 2010-06-14 NOTE — H&P (Signed)
[facility]  Patient:    Gerald Cruz, Gerald Cruz                        MRN: [id number] Adm. Date:  [id number] Attending:  Jasmine Pang CC:         [facility], Attention:  M. Pickett   Psychiatric Admission Assessment  IDENTIFYING INFORMATION:   A 60 year old Caucasian male from [place], [place] with a history of paranoid schizophrenia.  HISTORY OF PRESENT ILLNESS:  The patient states he was seeing "dogs threatening" him and "devils coming out of the walls."  He was sent to the emergency room where he stated he was also having auditory hallucinations.  He became extremely irritable, hostile and paranoid in the ER.  He has had some altercation with a neighbor who now states she is fearful of him.  PAST PSYCHIATRIC HISTORY:  History of chronic paranoid schizophrenia. The patient is seen at the [facility].  His case manager is M. Pickett.  He also sees a psychiatrist there.  He has been seen at [facility] for many years for treatmen tof his paranoid schizophrenia.  He is currently on lithium 300 mg p.o. t.i.d., Haldol 10 mg p.o. q.h.s.  He has apparently done well on this combination.  PAST MEDICAL HISTORY:  The patient sees Dr. Kennis Carina in [place].  He has medical problems consisting of diabetes, hypertension, asthma, and obesity.  MEDICATIONS:  Atenolol 5 mg p.o. b.i.d. and Glucophage 500 mg p.o. q.h.s.  DRUG ALLERGIES:  No known drug allergies.  SOCIAL HISTORY:  The patient lives by himself in an apartment.  He has some brothers and sisters who live in the area.  He states he came from a large family with 10 brothers and sisters.  FAMILY HISTORY:  Some of his brothers suffer from mental problems as well. The patient denies any physical or sexual abuse.  The patient admits to having been in prison for assault years ago.  SUBSTANCE ABUSE HISTORY:  The patient  has been clean for several months but has had a history of using alcohol, marijuana and crack cocaine.  He also smokes cigarettes 1-2 packs per day.  ADMISSION MENTAL STATUS EXAMINATION:  The patient presented as a sleepy but cooperative Caucasian male with poor eye contact.  He was lying in bed.  His speech was somewhat soft and slow, somewhat slurred.  There was psychomotor retardation.  Mood was irritable, angry, but pleasant at times.  His mood became happier as the interview progressed.  His affect was consistent with mood.  There was no suicidal ideation or homicidal ideation.  There was no present aggression but apparently he had been threatening at the [facility].  His thought processes were somewhat circumstantial and tangential.  Thought content revolved around his anger at the security guard who had to restrain him, stating "I dont think he got enough sleep the night before."  On cognitive exam, the patient was alert and oriented x 4.  Short term and long term memory appeared to be somewhat compromised but were adequate.  General fund of knowledge:  The patient appeared to function at a lower cognitive level than was age and education level appropriate.  His actual IQ is not known.  His attention and concentration were diminished as assessed by his distractability.  Insight was poor and judgment was poor.  DIAGNOSES: Axis I:  1. Paranoid schizophrenia, chronic.            2. History of polysubstance abuse. Axis II:   Deferred. Axis III:  1. Diabetes.            2. Hypertension.            3. Asthma.            4. Obesity. Axis IV:   Severe. Axis V:    Global assessment of functioning current 30, highest past year 50.  STRENGTHS AND ASSETS:  The patient is friendly and engaging.  The patient has a supportive case manager at the mental health center.  PROBLEMS:  Chronic paranoid schizophrenia with relapse and psychosis and paranoia.  Short term treatment  goal:  Resolution of paranoia and psychosis. Long term treatment goal:  Better stabilization of mood instability.  PLAN: 1. Continue Haldol 10 mg q.h.s. p.o., lithium 300 mg p.o. t.i.d. and get an    a.m. lithium level.  Will also continue his sleep medication, trazodone 100    mg p.o. q.h.s. and Cogentin 100 mg p.o. b.i.d. 2. Continue his Atenolol and Glucophage and monitor his medical status. 3. Will provide patient with case management and have them connect with his    case manager at the mental health center.  ANTICIPATED LENGTH OF STAY:  Three to five days.  CONDITION NECESSARY FOR DISCHARGE:  No longer paranoid or psychotic. Not aggressive or threatening.  POST HOSPITAL CARE PLANS:  Return home to live at his apartment if stable enough.  Follow-up therapy and medication management will continue at the [facility] DD:  02/10/00 TD:  02/11/00 Job: [phone] [id number]

## 2010-08-28 ENCOUNTER — Telehealth: Payer: Self-pay

## 2010-08-28 NOTE — Telephone Encounter (Signed)
Pt was referred from Dr. Lubertha South for a screening colonoscopy. Called [place], his case worker/guardian and scheduled an OV on 09/12/2010 @ 10:30 AM with Gerrit Halls, NP. Instructed her to bring his meds/ and or list and his insurance cards and to arrive 10-15 min early.

## 2010-09-12 ENCOUNTER — Telehealth: Payer: Self-pay | Admitting: Gastroenterology

## 2010-09-12 ENCOUNTER — Encounter: Payer: Self-pay | Admitting: Gastroenterology

## 2010-09-12 ENCOUNTER — Ambulatory Visit (INDEPENDENT_AMBULATORY_CARE_PROVIDER_SITE_OTHER): Payer: Medicare Other | Admitting: Gastroenterology

## 2010-09-12 DIAGNOSIS — Z1211 Encounter for screening for malignant neoplasm of colon: Secondary | ICD-10-CM

## 2010-09-12 NOTE — Telephone Encounter (Signed)
Please have patient take half dose of Lantus the evening prior to colonoscopy. Thanks!

## 2010-09-12 NOTE — Progress Notes (Signed)
Primary Care Physician:  [name], MD, MD Primary Gastroenterologist:  Dr. Darrick Penna   Chief Complaint  Patient presents with  . Colonoscopy    HPI:   [name] is a 60 year old Caucasian male who presents today for a screening colonoscopy. He has never had one. He reports he had been set up for one in the past, took the prep, and got scared the day of. He has a hx of mild mental retardation, schizoaffective disorder, as well as several other comorbiditites. He is quite pleasant and talkative.  He reports intermittent low-volume hematochezia. Somewhat of a poor historian and difficult to keep focused. Denies constipation, lack of appetite, or n/v. He reports vague abdominal cramps, yet he also states he cramps "all over".   Past Medical History  Diagnosis Date  . Parkinson disease   . Hypertension   . Hypercholesterolemia   . Schizoaffective disorder   . Mild mental retardation   . Diabetes mellitus   . OA (osteoarthritis)   . COPD (chronic obstructive pulmonary disease)   . GERD (gastroesophageal reflux disease)   . Migraines     Past Surgical History  Procedure Date  . Vein ligation and stripping   . Appendectomy     No current outpatient prescriptions on file.    Allergies as of 09/12/2010  . (No Known Allergies)    Family History  Problem Relation Age of Onset  . Heart attack      Mother, deceased  . Cancer Brother     unsure what type    History   Social History  . Marital Status: Single    Spouse Name: N/A    Number of Children: N/A  . Years of Education: N/A   Occupational History  . Not on file.   Social History Main Topics  . Smoking status: Current Everyday Smoker -- 2.0 packs/day for 54 years    Types: Cigarettes  . Smokeless tobacco: Not on file  . Alcohol Use: No  . Drug Use: No  . Sexually Active: Not on file   Other Topics Concern  . Not on file   Social History Narrative  . No narrative on file    Review of Systems: Gen:  Denies any fever, chills, fatigue, weight loss, lack of appetite.  CV: Denies chest pain, heart palpitations, peripheral edema, syncope.  Resp: Denies shortness of breath at rest or with exertion. Denies wheezing or cough.  GI: Denies dysphagia or odynophagia. Denies jaundice, hematemesis, fecal incontinence. GU : Denies urinary burning, urinary frequency, urinary hesitancy MS: Denies joint pain, muscle weakness, cramps, or limitation of movement.  Derm: Denies rash, itching, dry skin Psych: Denies depression, anxiety, memory loss, and confusion Heme: Denies bruising, bleeding, and enlarged lymph nodes.  Physical Exam: BP 120/79  Pulse 90  Temp(Src) 97.9 F (36.6 C) (Temporal)  Ht 5\' 6"  (1.676 m)  Wt 219 lb (99.338 kg)  BMI 35.35 kg/m2 General:   Alert and oriented. Pleasant and cooperative. Well-nourished and well-developed.  Head:  Normocephalic and atraumatic. Eyes:  Without icterus, sclera clear and conjunctiva pink.  Ears:  Normal auditory acuity. Nose:  No deformity, discharge,  or lesions. Mouth:  No deformity or lesions, oral mucosa pink. Very poor dentition.  Neck:  Supple, without mass or thyromegaly. Lungs:  Scattered rhonchi, expiratory wheeze bilaterally.  Heart:  S1, S2 present without murmurs appreciated.  Abdomen:  +BS, soft, non-tender and non-distended. No HSM noted. No guarding or rebound. No masses appreciated.  Rectal:  Deferred  Msk:  Symmetrical without gross deformities. Normal posture. Extremities:  Without clubbing or edema. Neurologic:  Alert and  oriented x4;  grossly normal neurologically. Slight parkinsonian tremor bilateral hands Skin:  Intact without significant lesions or rashes. Cervical Nodes:  No significant cervical adenopathy. Psych:  Alert and cooperative. Normal mood and affect.

## 2010-09-12 NOTE — Assessment & Plan Note (Addendum)
60 year old Caucasian male with no prior colonoscopy, remote hx of brbpr. Vague historian, hx of schizoaffective disorder, mild MR; he is quite pleasant and conversant. Vague reports of abdominal cramping, yet also reports diffuse cramping as well. No change in bowel habits. We will proceed with a colonoscopy with Propofol due to multiple psychiatric meds.  Proceed with colonoscopy with Dr. Darrick Penna, in the OR, in the near future. The risks, benefits, and alternatives have been discussed in detail with the patient. They state understanding and desire to proceed.  1/2 dose of Lantus evening before.

## 2010-09-12 NOTE — Patient Instructions (Signed)
We have set you up for a colonoscopy with Dr. Darrick Penna.  Further recommendations to follow when this is completed.

## 2010-09-13 NOTE — Progress Notes (Signed)
Cc to PCP 

## 2010-09-16 NOTE — Telephone Encounter (Signed)
LMOM to call.

## 2010-09-17 NOTE — Telephone Encounter (Signed)
LMOM at [phone] at [facility] number for pt to call. Also, called Sherron Monday @ [phone] and number not working.

## 2010-09-25 ENCOUNTER — Emergency Department (HOSPITAL_COMMUNITY)
Admission: EM | Admit: 2010-09-25 | Discharge: 2010-09-26 | Disposition: A | Discharge status: Home / Self Care | Payer: Medicare Other | Attending: Emergency Medicine | Admitting: Emergency Medicine

## 2010-09-25 ENCOUNTER — Encounter (HOSPITAL_COMMUNITY): Payer: Self-pay | Admitting: *Deleted

## 2010-09-25 DIAGNOSIS — I10 Essential (primary) hypertension: Secondary | ICD-10-CM | POA: Insufficient documentation

## 2010-09-25 DIAGNOSIS — J4489 Other specified chronic obstructive pulmonary disease: Secondary | ICD-10-CM | POA: Insufficient documentation

## 2010-09-25 DIAGNOSIS — E119 Type 2 diabetes mellitus without complications: Secondary | ICD-10-CM | POA: Insufficient documentation

## 2010-09-25 DIAGNOSIS — J449 Chronic obstructive pulmonary disease, unspecified: Secondary | ICD-10-CM | POA: Insufficient documentation

## 2010-09-25 DIAGNOSIS — E7800 Pure hypercholesterolemia, unspecified: Secondary | ICD-10-CM | POA: Insufficient documentation

## 2010-09-25 DIAGNOSIS — F411 Generalized anxiety disorder: Secondary | ICD-10-CM | POA: Insufficient documentation

## 2010-09-25 DIAGNOSIS — G20 Parkinson's disease: Secondary | ICD-10-CM | POA: Insufficient documentation

## 2010-09-25 DIAGNOSIS — F17200 Nicotine dependence, unspecified, uncomplicated: Secondary | ICD-10-CM | POA: Insufficient documentation

## 2010-09-25 DIAGNOSIS — G20A1 Parkinson's disease without dyskinesia, without mention of fluctuations: Secondary | ICD-10-CM | POA: Insufficient documentation

## 2010-09-25 DIAGNOSIS — Z760 Encounter for issue of repeat prescription: Secondary | ICD-10-CM | POA: Insufficient documentation

## 2010-09-25 DIAGNOSIS — F70 Mild intellectual disabilities: Secondary | ICD-10-CM | POA: Insufficient documentation

## 2010-09-25 LAB — URINALYSIS, ROUTINE W REFLEX MICROSCOPIC
Glucose, UA: 250 mg/dL — AB
Ketones, ur: NEGATIVE mg/dL
Leukocytes, UA: NEGATIVE
Nitrite: NEGATIVE
Specific Gravity, Urine: 1.01 (ref 1.005–1.030)
pH: 7 (ref 5.0–8.0)

## 2010-09-25 LAB — CBC
HCT: 46.4 % (ref 39.0–52.0)
MCH: 32.4 pg (ref 26.0–34.0)
MCV: 95.1 fL (ref 78.0–100.0)
Platelets: 211 10*3/uL (ref 150–400)
RBC: 4.88 MIL/uL (ref 4.22–5.81)
WBC: 9.1 10*3/uL (ref 4.0–10.5)

## 2010-09-25 LAB — COMPREHENSIVE METABOLIC PANEL
AST: 17 U/L (ref 0–37)
BUN: 10 mg/dL (ref 6–23)
CO2: 29 mEq/L (ref 19–32)
Calcium: 9.6 mg/dL (ref 8.4–10.5)
Chloride: 99 mEq/L (ref 96–112)
Creatinine, Ser: 0.97 mg/dL (ref 0.50–1.35)
GFR calc Af Amer: >60 mL/min (ref >60)
GFR calc non Af Amer: >60 mL/min (ref >60)
Total Bilirubin: 0.4 mg/dL (ref 0.3–1.2)

## 2010-09-25 LAB — ACETAMINOPHEN LEVEL: Acetaminophen (Tylenol), Serum: <15 ug/mL (ref 10–30)

## 2010-09-25 LAB — RAPID URINE DRUG SCREEN, HOSP PERFORMED
Cocaine: NOT DETECTED
Opiates: NOT DETECTED
Tetrahydrocannabinol: NOT DETECTED

## 2010-09-25 LAB — GLUCOSE, CAPILLARY: Glucose-Capillary: 201 mg/dL — ABNORMAL HIGH (ref 70–99)

## 2010-09-25 MED ORDER — ARIPIPRAZOLE 10 MG PO TABS
15.0000 mg | ORAL_TABLET | Freq: Once | ORAL | Status: AC
  Administered 2010-09-26: 15 mg via ORAL
  Filled 2010-09-25: qty 2

## 2010-09-25 MED ORDER — BENZTROPINE MESYLATE 1 MG PO TABS
1.0000 mg | ORAL_TABLET | Freq: Every day | ORAL | Status: DC
  Administered 2010-09-26: 1 mg via ORAL

## 2010-09-25 MED ORDER — LITHIUM CARBONATE ER 300 MG PO TBCR
300.0000 mg | EXTENDED_RELEASE_TABLET | Freq: Two times a day (BID) | ORAL | Status: DC
  Filled 2010-09-25: qty 1

## 2010-09-25 NOTE — ED Notes (Signed)
Pt c/o hearing voices x 1 day and ran out of his meds x 2 days; pt states the voices are not telling him to harm himself just different things

## 2010-09-26 MED ORDER — ARIPIPRAZOLE 10 MG PO TABS
ORAL_TABLET | ORAL | Status: AC
  Filled 2010-09-26: qty 2

## 2010-09-26 MED ORDER — TRAZODONE HCL 50 MG PO TABS
100.0000 mg | ORAL_TABLET | Freq: Every day | ORAL | Status: DC
  Administered 2010-09-26: 100 mg via ORAL

## 2010-09-26 MED ORDER — BENZTROPINE MESYLATE 1 MG PO TABS
ORAL_TABLET | ORAL | Status: AC
  Filled 2010-09-26: qty 1

## 2010-09-26 MED ORDER — LITHIUM CARBONATE 300 MG PO CAPS
300.0000 mg | ORAL_CAPSULE | Freq: Once | ORAL | Status: AC
  Administered 2010-09-26: 300 mg via ORAL

## 2010-09-26 MED ORDER — TRAZODONE HCL 50 MG PO TABS
ORAL_TABLET | ORAL | Status: AC
  Filled 2010-09-26: qty 2

## 2010-09-26 MED ORDER — HALOPERIDOL 5 MG PO TABS
10.0000 mg | ORAL_TABLET | Freq: Once | ORAL | Status: AC
  Administered 2010-09-26: 10 mg via ORAL
  Filled 2010-09-26: qty 2

## 2010-09-26 MED ORDER — LITHIUM CARBONATE 300 MG PO CAPS
ORAL_CAPSULE | ORAL | Status: AC
  Filled 2010-09-26: qty 1

## 2010-09-26 NOTE — ED Provider Notes (Signed)
History     CSN: [id number] Arrival date & time: 09/25/2010  9:28 PM  Chief Complaint  Patient presents with  . Medical Clearance   HPI Comments: Seen 2232.   Patient is a 60 y.o. male presenting with anxiety. The history is provided by the patient.  Anxiety This is a recurrent (Patient  with h/o schizoaffective d/o, mental retardation, parkinson's, who ran out of medicines 2 days ago and has been unable to get anyone to tak him out to Mental Health. Now hearing voices in the background. ) problem. The current episode started yesterday (Hearing voices that are telling him things but NOT telling him to hurt himself or others. Lives in an assisted living facility. Followed at [facility] The problem has not changed since onset.Pertinent negatives include no chest pain, no abdominal pain, no headaches and no shortness of breath. The symptoms are relieved by nothing. He has tried nothing for the symptoms.    Past Medical History  Diagnosis Date  . Parkinson disease   . Hypertension   . Hypercholesterolemia   . Schizoaffective disorder   . Mild mental retardation   . Diabetes mellitus   . OA (osteoarthritis)   . COPD (chronic obstructive pulmonary disease)   . GERD (gastroesophageal reflux disease)   . Migraines     Past Surgical History  Procedure Date  . Vein ligation and stripping   . Appendectomy     Family History  Problem Relation Age of Onset  . Heart attack      Mother, deceased  . Cancer Brother     unsure what type    History  Substance Use Topics  . Smoking status: Current Everyday Smoker -- 2.0 packs/day for 54 years    Types: Cigarettes  . Smokeless tobacco: Not on file  . Alcohol Use: No      Review of Systems  Respiratory: Negative for shortness of breath.   Cardiovascular: Negative for chest pain.  Gastrointestinal: Negative for abdominal pain.  Neurological: Negative for headaches.  Psychiatric/Behavioral:       Anxious about his medicines.  All  other systems reviewed and are negative.    Physical Exam  BP 133/79  Pulse 80  Temp(Src) 97.7 F (36.5 C) (Oral)  Resp 16  Wt 216 lb 3.2 oz (98.068 kg)  SpO2 98%  Physical Exam  Nursing note and vitals reviewed. Constitutional: He is oriented to person, place, and time. He appears well-developed and well-nourished.  HENT:  Head: Normocephalic and atraumatic.  Eyes: EOM are normal.  Neck: Normal range of motion. Neck supple.  Cardiovascular: Normal rate, normal heart sounds and intact distal pulses.   Pulmonary/Chest: Effort normal.  Abdominal: Soft.  Musculoskeletal:       Thenar wasting from diabetes. Decreased sensation in feet in stocking distribution from diabetes.  Neurological: He is alert and oriented to person, place, and time. No cranial nerve deficit.  Skin: Skin is warm and dry.  Psychiatric:       anxious    ED Course  Procedures  2245 Spoke with Samson Frederic, [place] who has contacted Maurine at [employer]. Spoke with [employer]. Explained need for medication renewal without lapse. She will given message to [facility] and Financial controller.  0015 Medications given patient in the ER. He will have [facility] arrange for transport to mental health tomorrow.  Results for orders placed during the hospital encounter of 09/25/10  CBC      Component Value Range   WBC 9.1  4.0 -  10.5 (K/uL)   RBC 4.88  4.22 - 5.81 (MIL/uL)   Hemoglobin 15.8  13.0 - 17.0 (g/dL)   HCT 14.7  82.9 - 56.2 (%)   MCV 95.1  78.0 - 100.0 (fL)   MCH 32.4  26.0 - 34.0 (pg)   MCHC 34.1  30.0 - 36.0 (g/dL)   RDW 13.0  86.5 - 78.4 (%)   Platelets 211  150 - 400 (K/uL)  COMPREHENSIVE METABOLIC PANEL      Component Value Range   Sodium 136  135 - 145 (mEq/L)   Potassium 4.1  3.5 - 5.1 (mEq/L)   Chloride 99  96 - 112 (mEq/L)   CO2 29  19 - 32 (mEq/L)   Glucose, Bld 211 (*) 70 - 99 (mg/dL)   BUN 10  6 - 23 (mg/dL)   Creatinine, Ser 6.96  0.50 - 1.35 (mg/dL)   Calcium 9.6  8.4 - 29.5 (mg/dL)   Total  Protein 6.8  6.0 - 8.3 (g/dL)   Albumin 3.7  3.5 - 5.2 (g/dL)   AST 17  0 - 37 (U/L)   ALT 19  0 - 53 (U/L)   Alkaline Phosphatase 92  39 - 117 (U/L)   Total Bilirubin 0.4  0.3 - 1.2 (mg/dL)   GFR calc non Af Amer >60  >60 (mL/min)   GFR calc Af Amer >60  >60 (mL/min)  ACETAMINOPHEN LEVEL      Component Value Range   Acetaminophen (Tylenol), Serum <15.0  10 - 30 (ug/mL)  URINE RAPID DRUG SCREEN (HOSP PERFORMED)      Component Value Range   Opiates NONE DETECTED  NONE DETECTED    Cocaine NONE DETECTED  NONE DETECTED    Benzodiazepines NONE DETECTED  NONE DETECTED    Amphetamines NONE DETECTED  NONE DETECTED    Tetrahydrocannabinol NONE DETECTED  NONE DETECTED    Barbiturates NONE DETECTED  NONE DETECTED   ETHANOL      Component Value Range   Alcohol, Ethyl (B) <11  0 - 11 (mg/dL)  GLUCOSE, CAPILLARY      Component Value Range   Glucose-Capillary 201 (*) 70 - 99 (mg/dL)  URINALYSIS, ROUTINE W REFLEX MICROSCOPIC      Component Value Range   Color, Urine STRAW (*) YELLOW    Appearance CLEAR  CLEAR    Specific Gravity, Urine 1.010  1.005 - 1.030    pH 7.0  5.0 - 8.0    Glucose, UA 250 (*) NEGATIVE (mg/dL)   Hgb urine dipstick NEGATIVE  NEGATIVE    Bilirubin Urine NEGATIVE  NEGATIVE    Ketones, ur NEGATIVE  NEGATIVE (mg/dL)   Protein, ur NEGATIVE  NEGATIVE (mg/dL)   Urobilinogen, UA 1.0  0.0 - 1.0 (mg/dL)   Nitrite NEGATIVE  NEGATIVE    Leukocytes, UA NEGATIVE  NEGATIVE   0035 [facility] here to take patient home.  MDM Reviewed: nursing note and vitals Interpretation: labs Consults: Spoke with [facility] for region.         Nicoletta Dress. Colon Branch, MD 09/26/10 [phone]

## 2010-09-26 NOTE — ED Notes (Signed)
Pt left er stating no needs [place] police to give him a ride home

## 2010-09-27 ENCOUNTER — Telehealth: Payer: Self-pay | Admitting: Gastroenterology

## 2010-09-27 NOTE — Telephone Encounter (Signed)
I spoke with Kennyth Arnold and informed her of what Mr Mester had said, she stated that he was just scared and that they would be proceeding with the TCS. She stated that she would talk to him about keeping this appt. If there are any further questions or concerns she can be reached at [phone].

## 2010-10-01 NOTE — Telephone Encounter (Signed)
Pt has cancelled procedure.

## 2010-10-03 NOTE — Telephone Encounter (Signed)
noted 

## 2010-10-10 ENCOUNTER — Telehealth: Payer: Self-pay | Admitting: Gastroenterology

## 2010-10-10 NOTE — Telephone Encounter (Signed)
Procedure cancelled by patient's POA, Stacy. The patient is being moved to a different facility.

## 2010-10-15 ENCOUNTER — Encounter (HOSPITAL_COMMUNITY): Payer: Medicare Other

## 2010-10-21 ENCOUNTER — Ambulatory Visit (HOSPITAL_COMMUNITY): Admission: RE | Admit: 2010-10-21 | Payer: Medicare Other | Source: Ambulatory Visit | Admitting: Gastroenterology

## 2010-10-21 ENCOUNTER — Encounter (HOSPITAL_COMMUNITY): Admission: RE | Payer: Self-pay | Source: Ambulatory Visit

## 2010-10-21 SURGERY — COLONOSCOPY WITH PROPOFOL
Anesthesia: Monitor Anesthesia Care

## 2010-10-25 LAB — COMPREHENSIVE METABOLIC PANEL
BUN: 11
CO2: 24
Calcium: 9.2
Chloride: 107
Creatinine, Ser: 0.78
GFR calc non Af Amer: >60
Total Bilirubin: 0.5

## 2010-10-25 LAB — CBC
HCT: 43.4
MCHC: 34.8
MCV: 96.6
Platelets: 200
RBC: 4.49
WBC: 10

## 2010-10-25 LAB — URINALYSIS, ROUTINE W REFLEX MICROSCOPIC
Bilirubin Urine: NEGATIVE
Nitrite: NEGATIVE
Specific Gravity, Urine: 1.005
Urobilinogen, UA: 0.2
pH: 5

## 2010-10-25 LAB — URINE CULTURE

## 2010-10-25 LAB — DIFFERENTIAL
Basophils Absolute: 0
Lymphocytes Relative: 14
Neutro Abs: 7.8 — ABNORMAL HIGH

## 2010-10-25 LAB — POCT CARDIAC MARKERS
CKMB, poc: 5.8
Myoglobin, poc: 198
Operator id: 217071
Troponin i, poc: <0.05

## 2010-10-25 LAB — CARDIAC PANEL(CRET KIN+CKTOT+MB+TROPI)
CK, MB: 11.4 — ABNORMAL HIGH
Relative Index: 4.2 — ABNORMAL HIGH

## 2010-11-12 LAB — RAPID URINE DRUG SCREEN, HOSP PERFORMED
Amphetamines: NOT DETECTED
Barbiturates: NOT DETECTED
Benzodiazepines: NOT DETECTED
Cocaine: NOT DETECTED
Opiates: NOT DETECTED
Tetrahydrocannabinol: NOT DETECTED

## 2010-11-12 LAB — BASIC METABOLIC PANEL
Chloride: 106
GFR calc Af Amer: >60
GFR calc non Af Amer: >60
Glucose, Bld: 169 — ABNORMAL HIGH
Potassium: 3.7
Potassium: 3.7
Sodium: 136
Sodium: 137

## 2010-11-12 LAB — URINALYSIS, ROUTINE W REFLEX MICROSCOPIC
Glucose, UA: NEGATIVE
Glucose, UA: NEGATIVE
Hgb urine dipstick: NEGATIVE
Ketones, ur: NEGATIVE
Specific Gravity, Urine: 1.01
pH: 6
pH: 6

## 2010-11-12 LAB — CBC
HCT: 43.9
HCT: 45
Hemoglobin: 15.4
MCV: 94.3
RBC: 4.77
RDW: 12.7
WBC: 9.4
WBC: 9.7

## 2010-11-12 LAB — DIFFERENTIAL
Eosinophils Absolute: 0.2
Eosinophils Relative: 2
Eosinophils Relative: 2
Lymphocytes Relative: 26
Lymphocytes Relative: 29
Lymphs Abs: 2.5
Lymphs Abs: 2.7
Monocytes Absolute: 0.8 — ABNORMAL HIGH
Monocytes Relative: 8

## 2010-11-12 LAB — ETHANOL: Alcohol, Ethyl (B): <5

## 2012-04-16 IMAGING — CR DG CHEST 1V PORT
1 series · 1 of 1 positions shown · non-contrast
Comparison: Chest x-ray of 11/02/2009

CLINICAL DATA: Weakness, blurred vision

PORTABLE CHEST - 1 VIEW

[view not recorded]
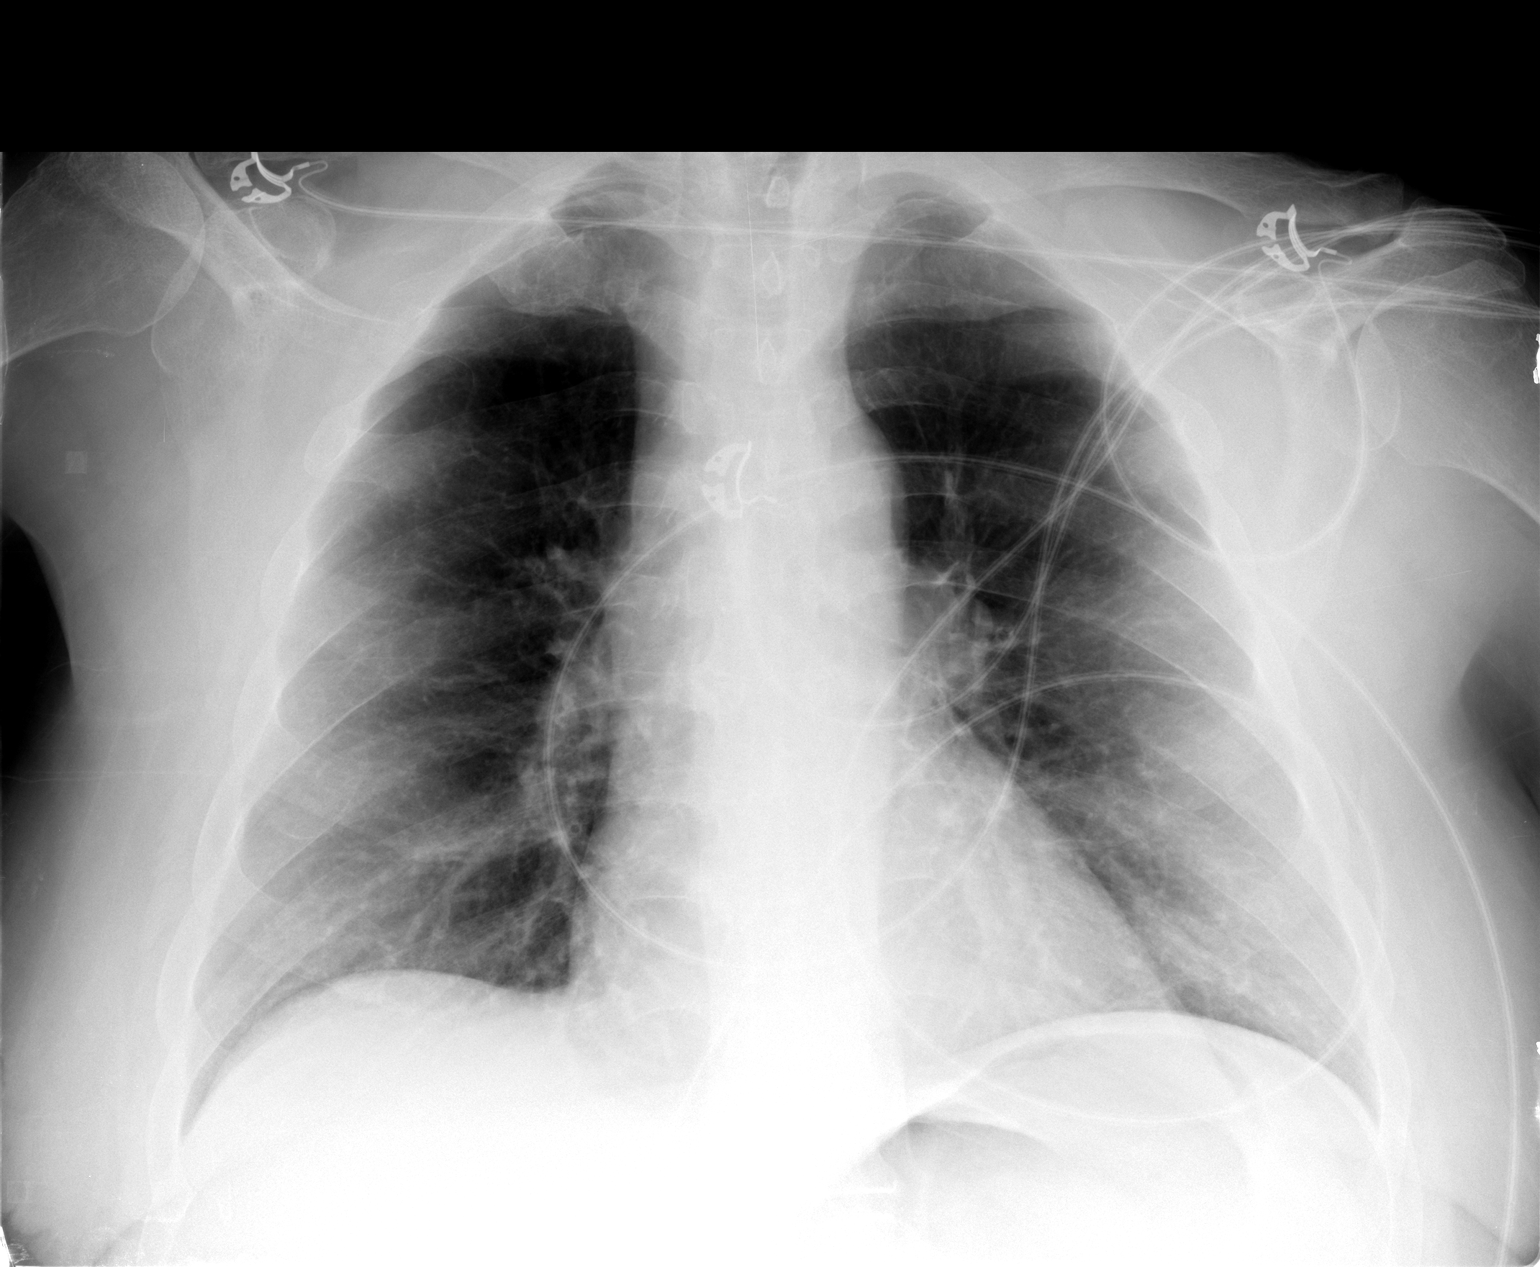

[1 of 1 positions shown; findings below may reference images not displayed]

FINDINGS: The lungs are clear.  Mediastinal contours appear stable.
The heart is within normal limits in size.  No bony abnormality is
seen.
IMPRESSION: No active lung disease.

## 2012-11-29 ENCOUNTER — Emergency Department (HOSPITAL_COMMUNITY): Payer: Medicare Other

## 2012-11-29 ENCOUNTER — Encounter (HOSPITAL_COMMUNITY): Payer: Self-pay | Admitting: Emergency Medicine

## 2012-11-29 ENCOUNTER — Observation Stay (HOSPITAL_COMMUNITY)
Admission: EM | Admit: 2012-11-29 | Discharge: 2012-11-30 | Disposition: A | Discharge status: Skilled Nursing Facility | Payer: Medicare Other | Attending: Family Medicine | Admitting: Family Medicine

## 2012-11-29 DIAGNOSIS — F259 Schizoaffective disorder, unspecified: Secondary | ICD-10-CM | POA: Insufficient documentation

## 2012-11-29 DIAGNOSIS — R0602 Shortness of breath: Secondary | ICD-10-CM | POA: Insufficient documentation

## 2012-11-29 DIAGNOSIS — J4489 Other specified chronic obstructive pulmonary disease: Principal | ICD-10-CM | POA: Insufficient documentation

## 2012-11-29 DIAGNOSIS — I10 Essential (primary) hypertension: Secondary | ICD-10-CM | POA: Diagnosis present

## 2012-11-29 DIAGNOSIS — E7800 Pure hypercholesterolemia, unspecified: Secondary | ICD-10-CM | POA: Insufficient documentation

## 2012-11-29 DIAGNOSIS — J449 Chronic obstructive pulmonary disease, unspecified: Secondary | ICD-10-CM

## 2012-11-29 DIAGNOSIS — J441 Chronic obstructive pulmonary disease with (acute) exacerbation: Secondary | ICD-10-CM | POA: Diagnosis present

## 2012-11-29 DIAGNOSIS — E119 Type 2 diabetes mellitus without complications: Secondary | ICD-10-CM | POA: Insufficient documentation

## 2012-11-29 DIAGNOSIS — K219 Gastro-esophageal reflux disease without esophagitis: Secondary | ICD-10-CM | POA: Diagnosis present

## 2012-11-29 LAB — BASIC METABOLIC PANEL
BUN: 9 mg/dL (ref 6–23)
CO2: 33 mEq/L — ABNORMAL HIGH (ref 19–32)
Calcium: 9.5 mg/dL (ref 8.4–10.5)
Creatinine, Ser: 0.67 mg/dL (ref 0.50–1.35)
GFR calc non Af Amer: >90 mL/min (ref >90)
Glucose, Bld: 284 mg/dL — ABNORMAL HIGH (ref 70–99)
Sodium: 137 mEq/L (ref 135–145)

## 2012-11-29 LAB — CBC WITH DIFFERENTIAL/PLATELET
Eosinophils Absolute: 0.2 10*3/uL (ref 0.0–0.7)
Eosinophils Relative: 3 % (ref 0–5)
HCT: 37.9 % — ABNORMAL LOW (ref 39.0–52.0)
Lymphs Abs: 1.5 10*3/uL (ref 0.7–4.0)
MCH: 31.8 pg (ref 26.0–34.0)
MCV: 94.3 fL (ref 78.0–100.0)
Monocytes Absolute: 0.9 10*3/uL (ref 0.1–1.0)
Platelets: 248 10*3/uL (ref 150–400)
RBC: 4.02 MIL/uL — ABNORMAL LOW (ref 4.22–5.81)

## 2012-11-29 MED ORDER — ENOXAPARIN SODIUM 30 MG/0.3ML ~~LOC~~ SOLN: 30.0000 mg | SUBCUTANEOUS | Status: DC

## 2012-11-29 MED ORDER — IPRATROPIUM BROMIDE 0.02 % IN SOLN
0.5000 mg | RESPIRATORY_TRACT | Status: DC
  Administered 2012-11-30: 0.5 mg via RESPIRATORY_TRACT
  Filled 2012-11-29: qty 2.5

## 2012-11-29 MED ORDER — FLEET ENEMA 7-19 GM/118ML RE ENEM: 1.0000 | ENEMA | Freq: Once | RECTAL | Status: AC | PRN

## 2012-11-29 MED ORDER — PANTOPRAZOLE SODIUM 40 MG PO TBEC
40.0000 mg | DELAYED_RELEASE_TABLET | Freq: Every day | ORAL | Status: DC
  Administered 2012-11-30: 40 mg via ORAL
  Filled 2012-11-29: qty 1

## 2012-11-29 MED ORDER — ALBUTEROL SULFATE (5 MG/ML) 0.5% IN NEBU
2.5000 mg | INHALATION_SOLUTION | RESPIRATORY_TRACT | Status: DC
  Administered 2012-11-30: 2.5 mg via RESPIRATORY_TRACT
  Filled 2012-11-29: qty 0.5

## 2012-11-29 MED ORDER — PREDNISONE 20 MG PO TABS
20.0000 mg | ORAL_TABLET | Freq: Once | ORAL | Status: AC
  Administered 2012-11-30: 20 mg via ORAL
  Filled 2012-11-29: qty 1

## 2012-11-29 MED ORDER — INSULIN ASPART 100 UNIT/ML ~~LOC~~ SOLN
0.0000 [IU] | Freq: Three times a day (TID) | SUBCUTANEOUS | Status: DC
  Administered 2012-11-30: 5 [IU] via SUBCUTANEOUS

## 2012-11-29 MED ORDER — FENOFIBRATE 160 MG PO TABS
ORAL_TABLET | ORAL | Status: AC
  Filled 2012-11-29: qty 1

## 2012-11-29 MED ORDER — METHYLPREDNISOLONE SODIUM SUCC 125 MG IJ SOLR
125.0000 mg | Freq: Once | INTRAMUSCULAR | Status: AC
  Administered 2012-11-29: 125 mg via INTRAVENOUS
  Filled 2012-11-29: qty 2

## 2012-11-29 MED ORDER — PREDNISONE 20 MG PO TABS
50.0000 mg | ORAL_TABLET | Freq: Every day | ORAL | Status: DC
  Administered 2012-11-30: 50 mg via ORAL
  Filled 2012-11-29: qty 1
  Filled 2012-11-29: qty 2

## 2012-11-29 MED ORDER — INSULIN ASPART 100 UNIT/ML ~~LOC~~ SOLN
0.0000 [IU] | Freq: Every day | SUBCUTANEOUS | Status: DC
Start: 2012-11-29 — End: 2012-11-30
  Administered 2012-11-30: 5 [IU] via SUBCUTANEOUS

## 2012-11-29 MED ORDER — TRAZODONE HCL 50 MG PO TABS
100.0000 mg | ORAL_TABLET | Freq: Three times a day (TID) | ORAL | Status: DC
  Administered 2012-11-30 (×2): 100 mg via ORAL
  Filled 2012-11-29 (×2): qty 2

## 2012-11-29 MED ORDER — FENOFIBRATE 160 MG PO TABS
160.0000 mg | ORAL_TABLET | Freq: Every day | ORAL | Status: DC
  Administered 2012-11-29: 160 mg via ORAL
  Filled 2012-11-29 (×2): qty 1

## 2012-11-29 MED ORDER — RISPERIDONE 1 MG PO TABS
1.0000 mg | ORAL_TABLET | Freq: Two times a day (BID) | ORAL | Status: DC
  Administered 2012-11-30 (×2): 1 mg via ORAL
  Filled 2012-11-29 (×2): qty 1

## 2012-11-29 MED ORDER — ONDANSETRON HCL 4 MG/2ML IJ SOLN: 4.0000 mg | INTRAMUSCULAR | Status: DC | PRN

## 2012-11-29 MED ORDER — METFORMIN HCL 500 MG PO TABS
1000.0000 mg | ORAL_TABLET | Freq: Two times a day (BID) | ORAL | Status: DC
  Administered 2012-11-30: 1000 mg via ORAL
  Filled 2012-11-29: qty 2

## 2012-11-29 MED ORDER — SIMVASTATIN 20 MG PO TABS: 20.0000 mg | ORAL_TABLET | Freq: Every day | ORAL | Status: DC

## 2012-11-29 MED ORDER — ALBUTEROL SULFATE (5 MG/ML) 0.5% IN NEBU
5.0000 mg | INHALATION_SOLUTION | Freq: Once | RESPIRATORY_TRACT | Status: AC
  Administered 2012-11-29: 5 mg via RESPIRATORY_TRACT

## 2012-11-29 MED ORDER — METOPROLOL SUCCINATE ER 25 MG PO TB24
25.0000 mg | ORAL_TABLET | Freq: Every morning | ORAL | Status: DC
  Administered 2012-11-30: 25 mg via ORAL
  Filled 2012-11-29: qty 1

## 2012-11-29 MED ORDER — LISINOPRIL 5 MG PO TABS
5.0000 mg | ORAL_TABLET | Freq: Every morning | ORAL | Status: DC
  Administered 2012-11-30: 5 mg via ORAL
  Filled 2012-11-29: qty 1

## 2012-11-29 MED ORDER — IPRATROPIUM BROMIDE 0.02 % IN SOLN
0.5000 mg | Freq: Once | RESPIRATORY_TRACT | Status: AC
  Administered 2012-11-29: 0.5 mg via RESPIRATORY_TRACT

## 2012-11-29 MED ORDER — ACETAMINOPHEN 325 MG PO TABS: 650.0000 mg | ORAL_TABLET | ORAL | Status: DC | PRN

## 2012-11-29 MED ORDER — GUAIFENESIN ER 600 MG PO TB12
1200.0000 mg | ORAL_TABLET | Freq: Two times a day (BID) | ORAL | Status: DC
  Administered 2012-11-30 (×2): 1200 mg via ORAL
  Filled 2012-11-29 (×2): qty 2

## 2012-11-29 MED ORDER — LORATADINE 10 MG PO TABS
10.0000 mg | ORAL_TABLET | Freq: Every day | ORAL | Status: DC
  Administered 2012-11-30: 10 mg via ORAL
  Filled 2012-11-29: qty 1

## 2012-11-29 MED ORDER — ALBUTEROL SULFATE (5 MG/ML) 0.5% IN NEBU
5.0000 mg | INHALATION_SOLUTION | Freq: Once | RESPIRATORY_TRACT | Status: DC
  Filled 2012-11-29: qty 1

## 2012-11-29 MED ORDER — BUDESONIDE-FORMOTEROL FUMARATE 80-4.5 MCG/ACT IN AERO
2.0000 | INHALATION_SPRAY | Freq: Two times a day (BID) | RESPIRATORY_TRACT | Status: DC
  Filled 2012-11-29: qty 6.9

## 2012-11-29 MED ORDER — POLYETHYLENE GLYCOL 3350 17 GM/SCOOP PO POWD
17.0000 g | Freq: Every morning | ORAL | Status: DC
  Filled 2012-11-29: qty 255

## 2012-11-29 MED ORDER — DOCUSATE SODIUM 100 MG PO CAPS
100.0000 mg | ORAL_CAPSULE | Freq: Every morning | ORAL | Status: DC
  Administered 2012-11-30: 100 mg via ORAL
  Filled 2012-11-29: qty 1

## 2012-11-29 MED ORDER — ASPIRIN 81 MG PO CHEW
81.0000 mg | CHEWABLE_TABLET | Freq: Every day | ORAL | Status: DC
  Administered 2012-11-30: 81 mg via ORAL
  Filled 2012-11-29: qty 1

## 2012-11-29 MED ORDER — IPRATROPIUM BROMIDE 0.02 % IN SOLN
0.5000 mg | Freq: Once | RESPIRATORY_TRACT | Status: DC
  Filled 2012-11-29: qty 2.5

## 2012-11-29 MED ORDER — POTASSIUM CHLORIDE IN NACL 20-0.9 MEQ/L-% IV SOLN
INTRAVENOUS | Status: DC
  Administered 2012-11-29: via INTRAVENOUS

## 2012-11-29 NOTE — ED Notes (Signed)
Patient c/o nothing we've done helping him.  Dr. Estell Harpin made aware of patients continued complaints

## 2012-11-29 NOTE — ED Notes (Signed)
Pt eating dinner  No distress noted.

## 2012-11-29 NOTE — ED Provider Notes (Signed)
CSN: [id number]     Arrival date & time 11/29/12  1540 History  This chart was scribed for Benny Lennert, MD by Leone Payor, ED Scribe. This patient was seen in room APA04/APA04 and the patient's care was started 4:49 PM.    Chief Complaint  Patient presents with  . Shortness of Breath    Patient is a 62 y.o. male presenting with shortness of breath. The history is provided by the patient. No language interpreter was used.  Shortness of Breath Severity:  Moderate Onset quality:  Gradual Duration:  5 days Timing:  Constant Progression:  Worsening Chronicity:  Chronic Context comment:  Recently moved to a different living facility Associated symptoms: cough and fever   Associated symptoms: no abdominal pain, no chest pain, no headaches and no rash     HPI Comments: Gerald Cruz is a 62 y.o. male with past medical history of COPD, HTN, Parkinson disease, schizoaffective disorder, mild mental retardation who presents to the Emergency Department complaining of constant, gradually worsening SOB that began about 5 days ago. Per nursing note, pt recently moved to [facility] and has been there for the past 5 days as well. He also reports having associated fever and cough.   PCP is Luking  Past Medical History  Diagnosis Date  . Parkinson disease   . Hypertension   . Hypercholesterolemia   . Schizoaffective disorder   . Mild mental retardation   . Diabetes mellitus   . OA (osteoarthritis)   . COPD (chronic obstructive pulmonary disease)   . GERD (gastroesophageal reflux disease)   . Migraines    Past Surgical History  Procedure Laterality Date  . Vein ligation and stripping    . Appendectomy     Family History  Problem Relation Age of Onset  . Heart attack      Mother, deceased  . Cancer Brother     unsure what type   History  Substance Use Topics  . Smoking status: Current Every Day Smoker -- 2.00 packs/day for 54 years    Types: Cigarettes  . Smokeless tobacco: Not  on file  . Alcohol Use: No    Review of Systems  Constitutional: Positive for fever. Negative for appetite change and fatigue.  HENT: Negative for congestion, ear discharge and sinus pressure.   Eyes: Negative for discharge.  Respiratory: Positive for cough and shortness of breath.   Cardiovascular: Negative for chest pain.  Gastrointestinal: Negative for abdominal pain and diarrhea.  Genitourinary: Negative for frequency and hematuria.  Musculoskeletal: Negative for back pain.  Skin: Negative for rash.  Neurological: Negative for seizures and headaches.  Psychiatric/Behavioral: Negative for hallucinations.    Allergies  Review of patient's allergies indicates no known allergies.  Home Medications   Current Outpatient Rx  Name  Route  Sig  Dispense  Refill  . aspirin 81 MG chewable tablet   Oral   Chew 81 mg by mouth daily.         . budesonide-formoterol (SYMBICORT) 80-4.5 MCG/ACT inhaler   Inhalation   Inhale 2 puffs into the lungs 2 (two) times daily.         . cetirizine (ZYRTEC) 10 MG tablet   Oral   Take 10 mg by mouth every morning.         . cyanocobalamin (,VITAMIN B-12,) 1000 MCG/ML injection   Intramuscular   Inject 1,000 mcg into the muscle every 30 (thirty) days.         [name] Kitchen  docusate sodium (COLACE) 100 MG capsule   Oral   Take 100 mg by mouth every morning.         . fenofibrate 160 MG tablet   Oral   Take 160 mg by mouth at bedtime.          . insulin lispro (HUMALOG) 100 UNIT/ML injection   Subcutaneous   Inject 12 Units into the skin 3 (three) times daily before meals.         [name] Kitchen ipratropium-albuterol (DUONEB) 0.5-2.5 (3) MG/3ML SOLN   Nebulization   Take 3 mLs by nebulization every 6 (six) hours as needed (for shortness of breath and/or wheezing).         [name] Kitchen lisinopril (PRINIVIL,ZESTRIL) 5 MG tablet   Oral   Take 5 mg by mouth every morning.         . metFORMIN (GLUCOPHAGE) 1000 MG tablet   Oral   Take 1,000 mg by mouth 2  (two) times daily.         . metoprolol succinate (TOPROL-XL) 25 MG 24 hr tablet   Oral   Take 25 mg by mouth every morning.         . polyethylene glycol powder (GLYCOLAX/MIRALAX) powder   Oral   Take 17 g by mouth every morning.         . pravastatin (PRAVACHOL) 40 MG tablet   Oral   Take 40 mg by mouth at bedtime.         . ranitidine (ZANTAC) 300 MG tablet   Oral   Take 300 mg by mouth every morning.         . risperiDONE (RISPERDAL) 1 MG tablet   Oral   Take 1 mg by mouth 2 (two) times daily.         [name] Kitchen tiotropium (SPIRIVA) 18 MCG inhalation capsule   Inhalation   Place 18 mcg into inhaler and inhale daily.         . traZODone (DESYREL) 100 MG tablet   Oral   Take 100 mg by mouth 3 (three) times daily.          BP 131/71  Pulse 101  Temp(Src) 98.2 F (36.8 C) (Oral)  Resp 22  Wt 230 lb (104.327 kg)  SpO2 94% Physical Exam  Nursing note and vitals reviewed. Constitutional: He is oriented to person, place, and time. He appears well-developed.  HENT:  Head: Normocephalic.  Eyes: Conjunctivae and EOM are normal. No scleral icterus.  Neck: Neck supple. No thyromegaly present.  Cardiovascular: Normal rate and regular rhythm.  Exam reveals no gallop and no friction rub.   No murmur heard. Pulmonary/Chest: No stridor. He has wheezes (Moderate wheezing bilaterally ). He has no rales. He exhibits no tenderness.  Abdominal: He exhibits no distension. There is no tenderness. There is no rebound.  Musculoskeletal: Normal range of motion. He exhibits no edema.  Lymphadenopathy:    He has no cervical adenopathy.  Neurological: He is oriented to person, place, and time. He exhibits normal muscle tone. Coordination normal.  Skin: No rash noted. No erythema.  Psychiatric: He has a normal mood and affect. His behavior is normal.    ED Course  Procedures   DIAGNOSTIC STUDIES: Oxygen Saturation is 94% on RA, adequate by my interpretation.    COORDINATION  OF CARE: 5:21 PM Discussed treatment plan with pt at bedside and pt agreed to plan.   7:22 PM  Upon recheck, pt has mild wheezing bilaterally.  Labs Review Labs Reviewed  CBC WITH DIFFERENTIAL - Abnormal; Notable for the following:    RBC 4.02 (*)    Hemoglobin 12.8 (*)    HCT 37.9 (*)    All other components within normal limits  BASIC METABOLIC PANEL - Abnormal; Notable for the following:    CO2 33 (*)    Glucose, Bld 284 (*)    All other components within normal limits  GLUCOSE, CAPILLARY - Abnormal; Notable for the following:    Glucose-Capillary 266 (*)    All other components within normal limits   Imaging Review Dg Chest 2 View  11/29/2012   CLINICAL DATA:  Shortness of breath, congestion, COPD, smoker  EXAM: CHEST  2 VIEW  COMPARISON:  09/16/2010  FINDINGS: Hyperinflation compatible with COPD/emphysema. Normal heart size and vascularity. No superimposed pneumonia, CHF, collapse or consolidation. No effusion or pneumothorax. Trachea is midline. Degenerative changes of the spine diffusely.  IMPRESSION: Hyperinflation. Stable exam. No superimposed acute process   Electronically Signed   By: Ruel Favors M.D.   On: 11/29/2012 16:43    EKG Interpretation     Ventricular Rate:  103 PR Interval:  178 QRS Duration: 110 QT Interval:  364 QTC Calculation: 476 R Axis:   -50 Text Interpretation:  Sinus tachycardia Left axis deviation Incomplete right bundle branch block Left ventricular hypertrophy with repolarization abnormality Cannot rule out Septal infarct , age undetermined Abnormal ECG            MDM  No diagnosis found.  The chart was scribed for me under my direct supervision.  I personally performed the history, physical, and medical decision making and all procedures in the evaluation of this patient.Benny Lennert, MD 11/29/12 [phone]

## 2012-11-29 NOTE — H&P (Signed)
[facility] History and Physical  Gerald Cruz  ZOX:[id number]  DOB: 09-30-1950   DOA: 11/29/2012   PCP:   [name], MD   Chief Complaint:  Short of breath since today  HPI: Gerald Cruz is a 62 y.o. male.  Obese Caucasian man with a history of COPD and schizoaffective, a resident of [facility] rest home, was sent to the emergency room for shortness of breath presumably due to COPD exacerbation. She received initial treatment in the emergency room but did not settle for sufficiently and was sent upstairs for admission. Nurse's report she has been somewhat belligerent towards staff  On the ward patient alleges that he feels pretty much at his baseline but he insisted on coming because he has no oxygen at the rest home and he only came to get some oxygen. He is hoping to get a tank of oxygen on return home to the facility.  He reports that he started smoking at age 72, and currently smokes about 4 packs per day; he is not interested in quitting. He denies any new fever cough or cold or chest pain.  He reports that he was recently an inpatient for COPD exacerbation in [facility], but these records could not be found     Rewiew of Systems:  Denies any problem on quick review of systems; but also states he is not really interested in answering the questions he just wants to go to bed - and he wants some oxygen to go home with.    Past Medical History  Diagnosis Date  . Parkinson disease   . Hypertension   . Hypercholesterolemia   . Schizoaffective disorder   . Mild mental retardation   . Diabetes mellitus   . OA (osteoarthritis)   . COPD (chronic obstructive pulmonary disease)   . GERD (gastroesophageal reflux disease)   . Migraines     Past Surgical History  Procedure Laterality Date  . Vein ligation and stripping    . Appendectomy      Medications:  HOME MEDS: Prior to Admission medications   Medication Sig Start Date End Date Taking? Authorizing  Provider  aspirin 81 MG chewable tablet Chew 81 mg by mouth daily.   Yes Historical Provider, MD  budesonide-formoterol (SYMBICORT) 80-4.5 MCG/ACT inhaler Inhale 2 puffs into the lungs 2 (two) times daily.   Yes Historical Provider, MD  cetirizine (ZYRTEC) 10 MG tablet Take 10 mg by mouth every morning.   Yes Historical Provider, MD  cyanocobalamin (,VITAMIN B-12,) 1000 MCG/ML injection Inject 1,000 mcg into the muscle every 30 (thirty) days.   Yes Historical Provider, MD  docusate sodium (COLACE) 100 MG capsule Take 100 mg by mouth every morning.   Yes Historical Provider, MD  fenofibrate 160 MG tablet Take 160 mg by mouth at bedtime.  08/15/10  Yes Historical Provider, MD  insulin lispro (HUMALOG) 100 UNIT/ML injection Inject 12 Units into the skin 3 (three) times daily before meals.   Yes Historical Provider, MD  ipratropium-albuterol (DUONEB) 0.5-2.5 (3) MG/3ML SOLN Take 3 mLs by nebulization every 6 (six) hours as needed (for shortness of breath and/or wheezing).   Yes Historical Provider, MD  lisinopril (PRINIVIL,ZESTRIL) 5 MG tablet Take 5 mg by mouth every morning.   Yes Historical Provider, MD  metFORMIN (GLUCOPHAGE) 1000 MG tablet Take 1,000 mg by mouth 2 (two) times daily.   Yes Historical Provider, MD  metoprolol succinate (TOPROL-XL) 25 MG 24 hr tablet Take 25 mg by mouth  every morning.   Yes Historical Provider, MD  polyethylene glycol powder (GLYCOLAX/MIRALAX) powder Take 17 g by mouth every morning.   Yes Historical Provider, MD  pravastatin (PRAVACHOL) 40 MG tablet Take 40 mg by mouth at bedtime.   Yes Historical Provider, MD  ranitidine (ZANTAC) 300 MG tablet Take 300 mg by mouth every morning.   Yes Historical Provider, MD  risperiDONE (RISPERDAL) 1 MG tablet Take 1 mg by mouth 2 (two) times daily.   Yes Historical Provider, MD  tiotropium (SPIRIVA) 18 MCG inhalation capsule Place 18 mcg into inhaler and inhale daily.   Yes Historical Provider, MD  traZODone (DESYREL) 100 MG tablet  Take 100 mg by mouth 3 (three) times daily.   Yes Historical Provider, MD     Allergies:  No Known Allergies  Social History:   reports that he has been smoking Cigarettes.  He has a 108 pack-year smoking history. He does not have any smokeless tobacco history on file. He reports that he does not drink alcohol or use illicit drugs.  Family History: Family History  Problem Relation Age of Onset  . Heart attack      Mother, deceased  . Cancer Brother     unsure what type     Physical Exam: Filed Vitals:   11/29/12 1915 11/29/12 1916 11/29/12 2026 11/29/12 2101  BP: 131/71 131/71 145/93 112/50  Pulse: 102 101 102 88  Temp:   99 F (37.2 C)   TempSrc:      Resp: 25 22 24    Weight:      SpO2: 89% 94% 95% 92%   Blood pressure 112/50, pulse 88, temperature 99 F (37.2 C), temperature source Oral, resp. rate 24, weight 104.327 kg (230 lb), SpO2 92.00%. Body mass index is 37.14 kg/(m^2).   GEN:  Pleasant yet somewhat irritable obese Caucasian gentleman lying flat in bed in no acute distress; cooperative with exam PSYCH:  alert and oriented ;  neither anxious nor depressed; affect is inappropriate. HEENT: Mucous membranes pink and anicteric; PERRLA; EOM intact;  Breasts:: Not examined CHEST WALL: No tenderness CHEST: Tachypnea, l bilateral rhonchi HEART: Regular rate and rhythm; no murmurs rubs or gallops ABDOMEN: Obese, soft non-tender; no masses, no organomegaly, normal abdominal bowel sounds; no pannus; no intertriginous candida. Rectal Exam: Not done EXTREMITIES:  age-appropriate arthropathy of the hands and knees; no edema; no ulcerations. Genitalia: not examined PULSES: 2+ and symmetric SKIN: Normal hydration no rash or ulceration CNS: Cranial nerves 2-12 grossly intact no focal lateralizing neurologic deficit   Labs on Admission:  Basic Metabolic Panel:  Recent Labs Lab 11/29/12 1651  NA 137  K 4.5  CL 98  CO2 33*  GLUCOSE 284*  BUN 9  CREATININE 0.67   CALCIUM 9.5   Liver Function Tests: No results found for this basename: AST, ALT, ALKPHOS, BILITOT, PROT, ALBUMIN,  in the last 168 hours No results found for this basename: LIPASE, AMYLASE,  in the last 168 hours No results found for this basename: AMMONIA,  in the last 168 hours CBC:  Recent Labs Lab 11/29/12 1651  WBC 9.1  NEUTROABS 6.4  HGB 12.8*  HCT 37.9*  MCV 94.3  PLT 248   Cardiac Enzymes: No results found for this basename: CKTOTAL, CKMB, CKMBINDEX, TROPONINI,  in the last 168 hours BNP: No components found with this basename: POCBNP,  D-dimer: No components found with this basename: D-DIMER,  CBG:  Recent Labs Lab 11/29/12 1624  GLUCAP 266*  Radiological Exams on Admission: Dg Chest 2 View  11/29/2012   CLINICAL DATA:  Shortness of breath, congestion, COPD, smoker  EXAM: CHEST  2 VIEW  COMPARISON:  09/16/2010  FINDINGS: Hyperinflation compatible with COPD/emphysema. Normal heart size and vascularity. No superimposed pneumonia, CHF, collapse or consolidation. No effusion or pneumothorax. Trachea is midline. Degenerative changes of the spine diffusely.  IMPRESSION: Hyperinflation. Stable exam. No superimposed acute process   Electronically Signed   By: Ruel Favors M.D.   On: 11/29/2012 16:43     Assessment/Plan    Active Problems:   COPD (chronic obstructive pulmonary disease)  Continue nebulizations, oral steroid, oxygen supplementation as needed   GERD (gastroesophageal reflux disease)  Her proton X.   Hypertension  Continue low-dose Toprol and lisinopril   Hypercholesterolemia  Continue statin   Diabetes mellitus, type 2  Continue metformin give sliding scale insulin   Schizoaffective disorder  Continue antipsychotic medication  This gentleman's problems may be primarily psychiatric; I notice he is new to this facility from another facility   Other plans as per orders.  Code Status: Full code  Likely return to skilled nursing facility  in the morning  Arielys Wandersee Nocturnist [facility] Pager [phone]   11/29/2012, 9:11 PM

## 2012-11-29 NOTE — ED Notes (Signed)
Pt lives at [facility] now x 5 days. Has been sob since been there. Pt states chest started hurting this evening, to right side that was sharp but no pain at present. Slight sob noted with accessory muscle use. Can complete long sentences.

## 2012-11-29 NOTE — ED Notes (Addendum)
Patient cursing and upset about care. Telling staff that nobody is doing anything for him. States that he is going to the state and welfare to report this hospital. Explained to patient that his behavior towards staff will not be tolerated.

## 2012-11-30 DIAGNOSIS — I10 Essential (primary) hypertension: Secondary | ICD-10-CM

## 2012-11-30 DIAGNOSIS — K219 Gastro-esophageal reflux disease without esophagitis: Secondary | ICD-10-CM

## 2012-11-30 LAB — URINALYSIS, ROUTINE W REFLEX MICROSCOPIC
Bilirubin Urine: NEGATIVE
Leukocytes, UA: NEGATIVE
Protein, ur: NEGATIVE mg/dL
Urobilinogen, UA: 0.2 mg/dL (ref 0.0–1.0)

## 2012-11-30 LAB — GLUCOSE, CAPILLARY
Glucose-Capillary: 241 mg/dL — ABNORMAL HIGH (ref 70–99)
Glucose-Capillary: 377 mg/dL — ABNORMAL HIGH (ref 70–99)

## 2012-11-30 LAB — HEMOGLOBIN A1C: Hgb A1c MFr Bld: 8.4 % — ABNORMAL HIGH (ref <5.7)

## 2012-11-30 MED ORDER — PREDNISONE 50 MG PO TABS: 50.0000 mg | ORAL_TABLET | Freq: Every day | ORAL | Status: DC

## 2012-11-30 MED ORDER — ALBUTEROL SULFATE (5 MG/ML) 0.5% IN NEBU
2.5000 mg | INHALATION_SOLUTION | Freq: Four times a day (QID) | RESPIRATORY_TRACT | Status: DC
  Administered 2012-11-30: 2.5 mg via RESPIRATORY_TRACT
  Filled 2012-11-30: qty 0.5

## 2012-11-30 MED ORDER — IPRATROPIUM BROMIDE 0.02 % IN SOLN
0.5000 mg | Freq: Four times a day (QID) | RESPIRATORY_TRACT | Status: DC
  Administered 2012-11-30: 0.5 mg via RESPIRATORY_TRACT
  Filled 2012-11-30: qty 2.5

## 2012-11-30 MED ORDER — ENOXAPARIN SODIUM 40 MG/0.4ML ~~LOC~~ SOLN
40.0000 mg | SUBCUTANEOUS | Status: DC
  Administered 2012-11-30: 40 mg via SUBCUTANEOUS
  Filled 2012-11-30: qty 0.4

## 2012-11-30 MED ORDER — POLYETHYLENE GLYCOL 3350 17 G PO PACK
17.0000 g | PACK | Freq: Every day | ORAL | Status: DC
  Filled 2012-11-30: qty 1

## 2012-11-30 NOTE — Progress Notes (Signed)
Utilization review completed.  

## 2012-11-30 NOTE — Clinical Social Work Psychosocial (Signed)
Clinical Social Work Department BRIEF PSYCHOSOCIAL ASSESSMENT 11/30/2012  Patient:  Gerald Cruz, Gerald Cruz     Account Number:  [id number]     Admit date:  11/29/2012  Clinical Social Worker:  Ambrose Pancoast Intern Date/Time:  11/30/2012 01:05 PM  Referred by:  CSW  Date Referred:  11/30/2012 Referred for  ALF Placement   Other Referral:   Interview type:  Other - See comment Other interview type:   Facility [facility]) - Gerald Cruz    PSYCHOSOCIAL DATA Living Status:  FACILITY Admitted from facility:  [facility] Level of care:  Assisted Living Primary support name:  Gerald Cruz - Legal guardian Primary support relationship to patient:  NONE Degree of support available:   Supportive    CURRENT CONCERNS Current Concerns  Post-Acute Placement   Other Concerns:    SOCIAL WORK ASSESSMENT / PLAN Pt has legal guardian. Spoke with Gerald Cruz at [facility] who knew  little information about pt because he is very new to the facility. Confirms pt has been there since 11/26/12. She reports pt is a smoker. Per Gerald Cruz, pt ambulates with a walker and requires some assistance with bathing and toileting, but is able to feed himself. Facility confirmed that oxygen was delivered to them last night. She explained pt has a legal guardian, Gerald Cruz at [employer]. Attempted to call legal guardian and left multiple voicemails to return call. Facility agreeable to no FL2 due to less than 24hr observation. Script for PRN oxygen provided. Facility will provide transportation at d/c.   Assessment/plan status:  Psychosocial Support/Ongoing Assessment of Needs Other assessment/ plan:   Information/referral to community resources:    PATIENT'S/FAMILY'S RESPONSE TO PLAN OF CARE: Facility agreeable to pt's return and will provide transportation when discharged. CSW will continue to follow.    Gerald Cruz BSW Intern

## 2012-11-30 NOTE — Progress Notes (Addendum)
Pt very upset and agitated. Pt wants to leave. Hx of schizophrenia and mild mental retardation. O2 stats in upper 90's on and off O2.  MD made aware. MD states he will order a sitter.

## 2012-11-30 NOTE — Care Management Note (Addendum)
    Page 1 of 1   11/30/2012     1:32:53 PM   CARE MANAGEMENT NOTE 11/30/2012  Patient:  OSBORNE, SERIO   Account Number:  [id number]  Date Initiated:  11/30/2012  Documentation initiated by:  Sharrie Rothman  Subjective/Objective Assessment:   Pt admitted from [facility] ALF with COPD exacerbation. Pt requesting O2 at facility. Pt will return to facility at discharge.     Action/Plan:   CSW to arrange discharge to facility. Pt is fairly independent with ADL's. Pt at this time is not requiring home O2 and this was explained to pt but he is not quit understanding this due to psych issues. Pt may benefit from [facility] RN at [employer].   Anticipated DC Date:  12/01/2012   Anticipated DC Plan:  ASSISTED LIVING / REST HOME  In-house referral  Clinical Social Worker      DC Planning Services  CM consult      Choice offered to / List presented to:             Status of service:  Completed, signed off Medicare Important Message given?   (If response is "NO", the following Medicare IM given date fields will be blank) Date Medicare IM given:   Date Additional Medicare IM given:    Discharge Disposition:  ASSISTED LIVING  Per UR Regulation:    If discussed at Long Length of Stay Meetings, dates discussed:    Comments:  11/30/12 1330 Arlyss Queen, RN BSN CM Pt discharged back to [facility]. Pt has home O2 at the facility that has been arranged by the facility. No other CM needs noted.  11/30/12 0940 Arlyss Queen, RN BSN CM

## 2012-11-30 NOTE — Progress Notes (Signed)
[facility] PROGRESS NOTE  Gerald Cruz VWU:[id number] DOB: December 30, 1950 DOA: 11/29/2012 PCP: [name], MD  Assessment/Plan: COPD (chronic obstructive pulmonary disease)  - Wheezing still noted on exam - Pt on minimal O2 support - Continue nebulization as scheduled - Cont PO steroids - oxygen supplementation as needed   GERD (gastroesophageal reflux disease)  - Cont protonix.   Hypertension  - Continue low-dose Toprol and lisinopril  - BP stable  Hypercholesterolemia  - Continue statin   Diabetes mellitus, type 2  - Continue metformin - On SSI   Schizoaffective disorder  - Continue antipsychotic medication  Code Status: Full Family Communication: Pt in room (indicate person spoken with, relationship, and if by phone, the number) Disposition Plan: Pending  HPI/Subjective: Reports feeling better. Wants to go home.  Objective: Filed Vitals:   11/30/12 0005 11/30/12 0500 11/30/12 0529 11/30/12 0727  BP:   140/88   Pulse:   68   Temp:   97.5 F (36.4 C)   TempSrc:      Resp:   20   Height:      Weight:  97.16 kg (214 lb 3.2 oz)    SpO2: 95%  98% 97%    Intake/Output Summary (Last 24 hours) at 11/30/12 0800 Last data filed at 11/30/12 0500  Gross per 24 hour  Intake 255.83 ml  Output    700 ml  Net -444.17 ml   Filed Weights   11/29/12 1610 11/29/12 2142 11/30/12 0500  Weight: 104.327 kg (230 lb) 97.16 kg (214 lb 3.2 oz) 97.16 kg (214 lb 3.2 oz)    Exam:   General:  Awake, in nad  Cardiovascular: regular, s1, s2  Respiratory: wheezing on exam, normal resp effort  Abdomen: soft, nondistended  Musculoskeletal: perfused, no clubbing   Data Reviewed: Basic Metabolic Panel:  Recent Labs Lab 11/29/12 1651  NA 137  K 4.5  CL 98  CO2 33*  GLUCOSE 284*  BUN 9  CREATININE 0.67  CALCIUM 9.5   Liver Function Tests: No results found for this basename: AST, ALT, ALKPHOS, BILITOT, PROT, ALBUMIN,  in the last 168 hours No results found for  this basename: LIPASE, AMYLASE,  in the last 168 hours No results found for this basename: AMMONIA,  in the last 168 hours CBC:  Recent Labs Lab 11/29/12 1651  WBC 9.1  NEUTROABS 6.4  HGB 12.8*  HCT 37.9*  MCV 94.3  PLT 248   Cardiac Enzymes: No results found for this basename: CKTOTAL, CKMB, CKMBINDEX, TROPONINI,  in the last 168 hours BNP (last 3 results) No results found for this basename: PROBNP,  in the last 8760 hours CBG:  Recent Labs Lab 11/29/12 1624 11/30/12 0041 11/30/12 0722  GLUCAP 266* 377* 244*    No results found for this or any previous visit (from the past 240 hour(s)).   Studies: Dg Chest 2 View  11/29/2012   CLINICAL DATA:  Shortness of breath, congestion, COPD, smoker  EXAM: CHEST  2 VIEW  COMPARISON:  09/16/2010  FINDINGS: Hyperinflation compatible with COPD/emphysema. Normal heart size and vascularity. No superimposed pneumonia, CHF, collapse or consolidation. No effusion or pneumothorax. Trachea is midline. Degenerative changes of the spine diffusely.  IMPRESSION: Hyperinflation. Stable exam. No superimposed acute process   Electronically Signed   By: Ruel Favors M.D.   On: 11/29/2012 16:43    Scheduled Meds: . albuterol  2.5 mg Nebulization Q6H  . aspirin  81 mg Oral Daily  . budesonide-formoterol  2 puff  Inhalation BID  . docusate sodium  100 mg Oral q morning - 10a  . enoxaparin (LOVENOX) injection  30 mg Subcutaneous Q24H  . fenofibrate  160 mg Oral QHS  . guaiFENesin  1,200 mg Oral BID  . insulin aspart  0-15 Units Subcutaneous TID WC  . insulin aspart  0-5 Units Subcutaneous QHS  . ipratropium  0.5 mg Nebulization Q6H  . lisinopril  5 mg Oral q morning - 10a  . loratadine  10 mg Oral Daily  . metFORMIN  1,000 mg Oral BID WC  . metoprolol succinate  25 mg Oral q morning - 10a  . pantoprazole  40 mg Oral Daily  . polyethylene glycol  17 g Oral Daily  . predniSONE  50 mg Oral Q breakfast  . risperiDONE  1 mg Oral BID  . simvastatin   20 mg Oral q1800  . traZODone  100 mg Oral TID   Continuous Infusions: . 0.9 % NaCl with KCl 20 mEq / L 50 mL/hr at 11/29/12 2353    Active Problems:   COPD (chronic obstructive pulmonary disease)   GERD (gastroesophageal reflux disease)   Hypertension   Hypercholesterolemia   Diabetes mellitus, type 2   Schizoaffective disorder  Time spent:  Gerald Cruz K  [facility] Pager [phone]. If 7PM-7AM, please contact night-coverage at [email or web address], password [facility] 11/30/2012, 8:00 AM  LOS: 1 day

## 2012-11-30 NOTE — Progress Notes (Signed)
Inpatient Diabetes Program Recommendations  AACE/ADA: New Consensus Statement on Inpatient Glycemic Control (2013)  Target Ranges:  Prepandial:   less than 140 mg/dL      Peak postprandial:   less than 180 mg/dL (1-2 hours)      Critically ill patients:  140 - 180 mg/dL  Results for BURHAN, BARHAM (MRN [id number]) as of 11/30/2012 10:21  Ref. Range 11/29/2012 16:24 11/30/2012 00:41 11/30/2012 07:22  Glucose-Capillary Latest Range: 70-99 mg/dL 147 (H) 829 (H) 562 (H)    Inpatient Diabetes Program Recommendations Insulin - Basal: Please consider ordering low dose basal insulin while inpatient and on steroids; recommend starting with Levemir 10 units QHS. Insulin - Meal Coverage: If patient continues on steroids and post prandial glucose is consistently elevated, may want to consider ordering Novolog 3 units TID with meals for meal coverage.  Note: Patient has a history of diabetes and takes Humalog 12 units TID with meals and Metformin 1000 mg BID as an outpatient for diabetes management.  Currently, patient is ordered to receive Metformin 1000 mg BID, Novolog 0-15 units AC, and Novolog 0-5 units HS for inpatient glycemic control.  Initial lab glucose was 284 mg/dl on 13/0 @ 86:57.  Patient received Solumedrol 125 mg yesterday at 17:04 and is ordered Prednisone 50 mg QAM.  Steroids are likely contributing to hyperglycemia.  Please consider ordering low dose basal insulin while inpatient and on steroids; recommend starting with Levemir 10 units QHS.  If post prandial glucose is consistently elevated, may want to consider also ordering Novolog 3 units TID with meals for meal coverage.  Will continue to follow.  Thanks, Orlando Penner, RN, MSN, CCRN Diabetes Coordinator Inpatient Diabetes Program [phone] (Team Pager) [phone] (AP office) [phone] [facility] office)

## 2012-11-30 NOTE — Progress Notes (Signed)
Pt refuses to get blood drawn by lab. Importance of blood drawn stated to pt. Pt still refuses.

## 2012-11-30 NOTE — Discharge Summary (Signed)
Physician Discharge Summary  Gerald Cruz [id number]:[id number] DOB: December 28, 1950 DOA: 11/29/2012  PCP: [name], MD  Admit date: 11/29/2012 Discharge date: 11/30/2012  Time spent: 35 minutes  Recommendations for Outpatient Follow-up:  1. Follow up with PCP in 1-2 weeks  Discharge Diagnoses:  Active Problems:   COPD (chronic obstructive pulmonary disease)   GERD (gastroesophageal reflux disease)   Hypertension   Hypercholesterolemia   Diabetes mellitus, type 2   Schizoaffective disorder   Discharge Condition: Stable  Diet recommendation: Diabetic  Filed Weights   11/29/12 1610 11/29/12 2142 11/30/12 0500  Weight: 104.327 kg (230 lb) 97.16 kg (214 lb 3.2 oz) 97.16 kg (214 lb 3.2 oz)    History of present illness:  Gerald Cruz is a 62 y.o. male. Obese Caucasian man with a history of COPD and schizoaffective, a resident of [facility] rest home, was sent to the emergency room for shortness of breath presumably due to COPD exacerbation. She received initial treatment in the emergency room but did not settle for sufficiently and was sent upstairs for admission.  Nurse's report she has been somewhat belligerent towards staff  On the ward patient alleges that he feels pretty much at his baseline but he insisted on coming because he has no oxygen at the rest home and he only came to get some oxygen. He is hoping to get a tank of oxygen on return home to the facility.  He reports that he started smoking at age 77, and currently smokes about 4 packs per day; he is not interested in quitting.  He denies any new fever cough or cold or chest pain.  He reports that he was recently an inpatient for COPD exacerbation in [facility], but these records could not be found  Hospital Course:  COPD (chronic obstructive pulmonary disease)  - Wheezing noted on exam but pt remains on minimal O2 support  - Continue nebulization as scheduled  - Cont PO steroids  - oxygen supplementation as needed -  Discussed with social work - pt was previously set up for home O2, which was not set up until just now GERD (gastroesophageal reflux disease)  - Cont protonix.  Hypertension  - Continue low-dose Toprol and lisinopril  - BP stable  Hypercholesterolemia  - Continue statin  Diabetes mellitus, type 2  - Continue metformin  - On SSI  Schizoaffective disorder  - Continue antipsychotic medication  Discharge Exam: Filed Vitals:   11/30/12 0529 11/30/12 0727 11/30/12 0929 11/30/12 0957  BP: 140/88  135/75   Pulse: 68  75   Temp: 97.5 F (36.4 C)  97.5 F (36.4 C)   TempSrc:   Oral   Resp: 20  20   Height:      Weight:      SpO2: 98% 97% 97% 98%    General: Awake, in nad Cardiovascular: regular, s1, s2 Respiratory: normal resp effort, mild wheeizng  Discharge Instructions     Medication List         aspirin 81 MG chewable tablet  Chew 81 mg by mouth daily.     budesonide-formoterol 80-4.5 MCG/ACT inhaler  Commonly known as:  SYMBICORT  Inhale 2 puffs into the lungs 2 (two) times daily.     cetirizine 10 MG tablet  Commonly known as:  ZYRTEC  Take 10 mg by mouth every morning.     cyanocobalamin 1000 MCG/ML injection  Commonly known as:  (VITAMIN B-12)  Inject 1,000 mcg into the muscle every 30 (  thirty) days.     docusate sodium 100 MG capsule  Commonly known as:  COLACE  Take 100 mg by mouth every morning.     fenofibrate 160 MG tablet  Take 160 mg by mouth at bedtime.     insulin lispro 100 UNIT/ML injection  Commonly known as:  HUMALOG  Inject 12 Units into the skin 3 (three) times daily before meals.     ipratropium-albuterol 0.5-2.5 (3) MG/3ML Soln  Commonly known as:  DUONEB  Take 3 mLs by nebulization every 6 (six) hours as needed (for shortness of breath and/or wheezing).     lisinopril 5 MG tablet  Commonly known as:  PRINIVIL,ZESTRIL  Take 5 mg by mouth every morning.     metFORMIN 1000 MG tablet  Commonly known as:  GLUCOPHAGE  Take 1,000 mg  by mouth 2 (two) times daily.     metoprolol succinate 25 MG 24 hr tablet  Commonly known as:  TOPROL-XL  Take 25 mg by mouth every morning.     polyethylene glycol powder powder  Commonly known as:  GLYCOLAX/MIRALAX  Take 17 g by mouth every morning.     pravastatin 40 MG tablet  Commonly known as:  PRAVACHOL  Take 40 mg by mouth at bedtime.     predniSONE 50 MG tablet  Commonly known as:  DELTASONE  Take 1 tablet (50 mg total) by mouth daily with breakfast.     ranitidine 300 MG tablet  Commonly known as:  ZANTAC  Take 300 mg by mouth every morning.     risperiDONE 1 MG tablet  Commonly known as:  RISPERDAL  Take 1 mg by mouth 2 (two) times daily.     tiotropium 18 MCG inhalation capsule  Commonly known as:  SPIRIVA  Place 18 mcg into inhaler and inhale daily.     traZODone 100 MG tablet  Commonly known as:  DESYREL  Take 100 mg by mouth 3 (three) times daily.       No Known Allergies Follow-up Information   Follow up with [name], MD. Schedule an appointment as soon as possible for a visit in 1 week.   Specialty:  Family Medicine   Contact information:   [address] [place] [address] [phone]        The results of significant diagnostics from this hospitalization (including imaging, microbiology, ancillary and laboratory) are listed below for reference.    Significant Diagnostic Studies: Dg Chest 2 View  11/29/2012   CLINICAL DATA:  Shortness of breath, congestion, COPD, smoker  EXAM: CHEST  2 VIEW  COMPARISON:  09/16/2010  FINDINGS: Hyperinflation compatible with COPD/emphysema. Normal heart size and vascularity. No superimposed pneumonia, CHF, collapse or consolidation. No effusion or pneumothorax. Trachea is midline. Degenerative changes of the spine diffusely.  IMPRESSION: Hyperinflation. Stable exam. No superimposed acute process   Electronically Signed   By: Ruel Favors M.D.   On: 11/29/2012 16:43    Microbiology: No results  found for this or any previous visit (from the past 240 hour(s)).   Labs: Basic Metabolic Panel:  Recent Labs Lab 11/29/12 1651  NA 137  K 4.5  CL 98  CO2 33*  GLUCOSE 284*  BUN 9  CREATININE 0.67  CALCIUM 9.5   Liver Function Tests: No results found for this basename: AST, ALT, ALKPHOS, BILITOT, PROT, ALBUMIN,  in the last 168 hours No results found for this basename: LIPASE, AMYLASE,  in the last 168 hours No results  found for this basename: AMMONIA,  in the last 168 hours CBC:  Recent Labs Lab 11/29/12 1651  WBC 9.1  NEUTROABS 6.4  HGB 12.8*  HCT 37.9*  MCV 94.3  PLT 248   Cardiac Enzymes: No results found for this basename: CKTOTAL, CKMB, CKMBINDEX, TROPONINI,  in the last 168 hours BNP: BNP (last 3 results) No results found for this basename: PROBNP,  in the last 8760 hours CBG:  Recent Labs Lab 11/29/12 1624 11/30/12 0041 11/30/12 0722 11/30/12 1154  GLUCAP 266* 377* 244* 241*    Signed:  Krystall Kruckenberg K  [facility] 11/30/2012, 1:15 PM

## 2012-12-01 NOTE — Progress Notes (Signed)
Discharged to [facility] with transporter, condition stable.

## 2013-01-08 DIAGNOSIS — E119 Type 2 diabetes mellitus without complications: Secondary | ICD-10-CM

## 2013-01-08 DIAGNOSIS — D510 Vitamin B12 deficiency anemia due to intrinsic factor deficiency: Secondary | ICD-10-CM

## 2013-01-08 DIAGNOSIS — I10 Essential (primary) hypertension: Secondary | ICD-10-CM

## 2013-01-08 DIAGNOSIS — J449 Chronic obstructive pulmonary disease, unspecified: Secondary | ICD-10-CM

## 2013-01-17 ENCOUNTER — Encounter (HOSPITAL_COMMUNITY): Payer: Self-pay | Admitting: Emergency Medicine

## 2013-01-17 ENCOUNTER — Emergency Department (HOSPITAL_COMMUNITY): Payer: Medicare Other

## 2013-01-17 ENCOUNTER — Emergency Department (HOSPITAL_COMMUNITY)
Admission: EM | Admit: 2013-01-17 | Discharge: 2013-01-29 | Disposition: A | Discharge status: Home / Self Care | Payer: Medicare Other | Attending: Emergency Medicine | Admitting: Emergency Medicine

## 2013-01-17 DIAGNOSIS — F17200 Nicotine dependence, unspecified, uncomplicated: Secondary | ICD-10-CM | POA: Insufficient documentation

## 2013-01-17 DIAGNOSIS — F259 Schizoaffective disorder, unspecified: Secondary | ICD-10-CM | POA: Diagnosis present

## 2013-01-17 DIAGNOSIS — IMO0002 Reserved for concepts with insufficient information to code with codable children: Secondary | ICD-10-CM | POA: Insufficient documentation

## 2013-01-17 DIAGNOSIS — G20A1 Parkinson's disease without dyskinesia, without mention of fluctuations: Secondary | ICD-10-CM | POA: Insufficient documentation

## 2013-01-17 DIAGNOSIS — I10 Essential (primary) hypertension: Secondary | ICD-10-CM | POA: Insufficient documentation

## 2013-01-17 DIAGNOSIS — Z794 Long term (current) use of insulin: Secondary | ICD-10-CM | POA: Insufficient documentation

## 2013-01-17 DIAGNOSIS — E119 Type 2 diabetes mellitus without complications: Secondary | ICD-10-CM | POA: Insufficient documentation

## 2013-01-17 DIAGNOSIS — K219 Gastro-esophageal reflux disease without esophagitis: Secondary | ICD-10-CM | POA: Insufficient documentation

## 2013-01-17 DIAGNOSIS — F70 Mild intellectual disabilities: Secondary | ICD-10-CM | POA: Insufficient documentation

## 2013-01-17 DIAGNOSIS — E7800 Pure hypercholesterolemia, unspecified: Secondary | ICD-10-CM | POA: Insufficient documentation

## 2013-01-17 DIAGNOSIS — G43909 Migraine, unspecified, not intractable, without status migrainosus: Secondary | ICD-10-CM | POA: Insufficient documentation

## 2013-01-17 DIAGNOSIS — Z79899 Other long term (current) drug therapy: Secondary | ICD-10-CM | POA: Insufficient documentation

## 2013-01-17 DIAGNOSIS — J449 Chronic obstructive pulmonary disease, unspecified: Secondary | ICD-10-CM | POA: Insufficient documentation

## 2013-01-17 DIAGNOSIS — J4489 Other specified chronic obstructive pulmonary disease: Secondary | ICD-10-CM | POA: Insufficient documentation

## 2013-01-17 DIAGNOSIS — Z7982 Long term (current) use of aspirin: Secondary | ICD-10-CM | POA: Insufficient documentation

## 2013-01-17 DIAGNOSIS — M1990 Unspecified osteoarthritis, unspecified site: Secondary | ICD-10-CM | POA: Insufficient documentation

## 2013-01-17 DIAGNOSIS — F0281 Dementia in other diseases classified elsewhere with behavioral disturbance: Secondary | ICD-10-CM | POA: Insufficient documentation

## 2013-01-17 DIAGNOSIS — R45850 Homicidal ideations: Secondary | ICD-10-CM | POA: Insufficient documentation

## 2013-01-17 DIAGNOSIS — G20 Parkinson's disease: Secondary | ICD-10-CM | POA: Insufficient documentation

## 2013-01-17 DIAGNOSIS — F02818 Dementia in other diseases classified elsewhere, unspecified severity, with other behavioral disturbance: Secondary | ICD-10-CM | POA: Insufficient documentation

## 2013-01-17 LAB — RAPID URINE DRUG SCREEN, HOSP PERFORMED
Barbiturates: NOT DETECTED
Cocaine: NOT DETECTED
Opiates: NOT DETECTED
Tetrahydrocannabinol: NOT DETECTED

## 2013-01-17 LAB — CBC WITH DIFFERENTIAL/PLATELET
Eosinophils Absolute: 0.1 10*3/uL (ref 0.0–0.7)
HCT: 42.9 % (ref 39.0–52.0)
Lymphocytes Relative: 15 % (ref 12–46)
Lymphs Abs: 1.4 10*3/uL (ref 0.7–4.0)
MCH: 31.3 pg (ref 26.0–34.0)
Neutro Abs: 6.9 10*3/uL (ref 1.7–7.7)
Neutrophils Relative %: 75 % (ref 43–77)
Platelets: 232 10*3/uL (ref 150–400)
RBC: 4.5 MIL/uL (ref 4.22–5.81)
WBC: 9.2 10*3/uL (ref 4.0–10.5)

## 2013-01-17 LAB — ETHANOL: Alcohol, Ethyl (B): <11 mg/dL (ref 0–11)

## 2013-01-17 LAB — BASIC METABOLIC PANEL
BUN: 10 mg/dL (ref 6–23)
Chloride: 99 mEq/L (ref 96–112)
GFR calc non Af Amer: >90 mL/min (ref >90)
Glucose, Bld: 210 mg/dL — ABNORMAL HIGH (ref 70–99)
Potassium: 3.8 mEq/L (ref 3.5–5.1)
Sodium: 139 mEq/L (ref 135–145)

## 2013-01-17 MED ORDER — IPRATROPIUM-ALBUTEROL 0.5-2.5 (3) MG/3ML IN SOLN: 3.0000 mL | Freq: Four times a day (QID) | RESPIRATORY_TRACT | Status: DC | PRN

## 2013-01-17 MED ORDER — METOPROLOL SUCCINATE ER 25 MG PO TB24
25.0000 mg | ORAL_TABLET | Freq: Every morning | ORAL | Status: DC
  Administered 2013-01-18 – 2013-01-29 (×11): 25 mg via ORAL
  Filled 2013-01-17 (×13): qty 1

## 2013-01-17 MED ORDER — INSULIN ASPART 100 UNIT/ML ~~LOC~~ SOLN
14.0000 [IU] | Freq: Three times a day (TID) | SUBCUTANEOUS | Status: DC
  Administered 2013-01-17 – 2013-01-26 (×25): 14 [IU] via SUBCUTANEOUS
  Administered 2013-01-26: 18:00:00 via SUBCUTANEOUS
  Administered 2013-01-27 – 2013-01-29 (×6): 14 [IU] via SUBCUTANEOUS
  Filled 2013-01-17 (×11): qty 1

## 2013-01-17 MED ORDER — RISPERIDONE 1 MG PO TABS
1.0000 mg | ORAL_TABLET | Freq: Two times a day (BID) | ORAL | Status: DC
  Administered 2013-01-17 – 2013-01-24 (×14): 1 mg via ORAL
  Filled 2013-01-17 (×18): qty 1

## 2013-01-17 MED ORDER — ALBUTEROL SULFATE (5 MG/ML) 0.5% IN NEBU
2.5000 mg | INHALATION_SOLUTION | RESPIRATORY_TRACT | Status: DC
  Administered 2013-01-17 – 2013-01-24 (×28): 2.5 mg via RESPIRATORY_TRACT
  Filled 2013-01-17 (×29): qty 0.5

## 2013-01-17 MED ORDER — FENOFIBRATE 160 MG PO TABS
160.0000 mg | ORAL_TABLET | Freq: Every day | ORAL | Status: DC
  Administered 2013-01-17 – 2013-01-28 (×11): 160 mg via ORAL
  Filled 2013-01-17 (×13): qty 1

## 2013-01-17 MED ORDER — PREDNISONE 10 MG PO TABS
10.0000 mg | ORAL_TABLET | Freq: Every day | ORAL | Status: DC
  Administered 2013-01-18 – 2013-01-29 (×11): 10 mg via ORAL
  Filled 2013-01-17 (×11): qty 1

## 2013-01-17 MED ORDER — POLYETHYLENE GLYCOL 3350 17 G PO PACK
17.0000 g | PACK | Freq: Every day | ORAL | Status: DC
  Administered 2013-01-18 – 2013-01-22 (×5): 17 g via ORAL
  Filled 2013-01-17 (×3): qty 1

## 2013-01-17 MED ORDER — ALBUTEROL SULFATE (5 MG/ML) 0.5% IN NEBU
2.5000 mg | INHALATION_SOLUTION | Freq: Once | RESPIRATORY_TRACT | Status: AC
  Administered 2013-01-17: 2.5 mg via RESPIRATORY_TRACT
  Filled 2013-01-17: qty 0.5

## 2013-01-17 MED ORDER — LORATADINE 10 MG PO TABS
10.0000 mg | ORAL_TABLET | Freq: Every day | ORAL | Status: DC
  Administered 2013-01-18 – 2013-01-29 (×12): 10 mg via ORAL
  Filled 2013-01-17 (×7): qty 1

## 2013-01-17 MED ORDER — FAMOTIDINE 20 MG PO TABS
40.0000 mg | ORAL_TABLET | Freq: Every day | ORAL | Status: DC
  Administered 2013-01-18 – 2013-01-29 (×12): 40 mg via ORAL
  Filled 2013-01-17 (×7): qty 2

## 2013-01-17 MED ORDER — ALBUTEROL SULFATE (5 MG/ML) 0.5% IN NEBU
2.5000 mg | INHALATION_SOLUTION | Freq: Four times a day (QID) | RESPIRATORY_TRACT | Status: DC | PRN
  Filled 2013-01-17: qty 0.5

## 2013-01-17 MED ORDER — SIMVASTATIN 10 MG PO TABS
5.0000 mg | ORAL_TABLET | Freq: Every day | ORAL | Status: DC
  Administered 2013-01-18 – 2013-01-28 (×10): 5 mg via ORAL
  Filled 2013-01-17 (×12): qty 0.5

## 2013-01-17 MED ORDER — IPRATROPIUM BROMIDE 0.02 % IN SOLN
0.5000 mg | RESPIRATORY_TRACT | Status: DC
  Administered 2013-01-17 – 2013-01-24 (×28): 0.5 mg via RESPIRATORY_TRACT
  Filled 2013-01-17 (×29): qty 2.5

## 2013-01-17 MED ORDER — IPRATROPIUM BROMIDE 0.02 % IN SOLN
0.5000 mg | Freq: Four times a day (QID) | RESPIRATORY_TRACT | Status: DC | PRN
  Filled 2013-01-17: qty 2.5

## 2013-01-17 MED ORDER — LISINOPRIL 5 MG PO TABS
5.0000 mg | ORAL_TABLET | Freq: Every morning | ORAL | Status: DC
  Administered 2013-01-18 – 2013-01-29 (×12): 5 mg via ORAL
  Filled 2013-01-17 (×11): qty 1

## 2013-01-17 MED ORDER — BUDESONIDE-FORMOTEROL FUMARATE 80-4.5 MCG/ACT IN AERO
2.0000 | INHALATION_SPRAY | Freq: Two times a day (BID) | RESPIRATORY_TRACT | Status: DC
  Administered 2013-01-17 – 2013-01-29 (×20): 2 via RESPIRATORY_TRACT
  Filled 2013-01-17 (×2): qty 6.9

## 2013-01-17 MED ORDER — ASPIRIN 81 MG PO CHEW
81.0000 mg | CHEWABLE_TABLET | Freq: Every day | ORAL | Status: DC
  Administered 2013-01-18 – 2013-01-29 (×13): 81 mg via ORAL
  Filled 2013-01-17 (×7): qty 1

## 2013-01-17 MED ORDER — GABAPENTIN 300 MG PO CAPS
300.0000 mg | ORAL_CAPSULE | Freq: Every day | ORAL | Status: DC
  Administered 2013-01-17 – 2013-01-28 (×11): 300 mg via ORAL
  Filled 2013-01-17 (×13): qty 1

## 2013-01-17 MED ORDER — DOCUSATE SODIUM 100 MG PO CAPS
100.0000 mg | ORAL_CAPSULE | Freq: Every morning | ORAL | Status: DC
  Administered 2013-01-18 – 2013-01-27 (×6): 100 mg via ORAL
  Filled 2013-01-17 (×10): qty 1

## 2013-01-17 MED ORDER — TIOTROPIUM BROMIDE MONOHYDRATE 18 MCG IN CAPS
18.0000 ug | ORAL_CAPSULE | Freq: Every day | RESPIRATORY_TRACT | Status: DC
  Administered 2013-01-19 – 2013-01-29 (×8): 18 ug via RESPIRATORY_TRACT
  Filled 2013-01-17 (×2): qty 5

## 2013-01-17 MED ORDER — IPRATROPIUM BROMIDE 0.02 % IN SOLN
0.5000 mg | Freq: Once | RESPIRATORY_TRACT | Status: AC
  Administered 2013-01-17: 0.5 mg via RESPIRATORY_TRACT
  Filled 2013-01-17: qty 2.5

## 2013-01-17 MED ORDER — INSULIN DETEMIR 100 UNIT/ML ~~LOC~~ SOLN
65.0000 [IU] | Freq: Every day | SUBCUTANEOUS | Status: DC
  Administered 2013-01-17 – 2013-01-28 (×11): 65 [IU] via SUBCUTANEOUS
  Filled 2013-01-17 (×13): qty 0.65

## 2013-01-17 MED ORDER — POLYETHYLENE GLYCOL 3350 17 GM/SCOOP PO POWD: 17.0000 g | Freq: Every morning | ORAL | Status: DC

## 2013-01-17 MED ORDER — METFORMIN HCL 500 MG PO TABS
1000.0000 mg | ORAL_TABLET | Freq: Two times a day (BID) | ORAL | Status: DC
  Administered 2013-01-17 – 2013-01-29 (×23): 1000 mg via ORAL
  Filled 2013-01-17 (×23): qty 2

## 2013-01-17 MED ORDER — CYANOCOBALAMIN 1000 MCG/ML IJ SOLN
1000.0000 ug | INTRAMUSCULAR | Status: DC
  Filled 2013-01-17: qty 1

## 2013-01-17 MED ORDER — TRAZODONE HCL 50 MG PO TABS
100.0000 mg | ORAL_TABLET | Freq: Three times a day (TID) | ORAL | Status: DC
  Administered 2013-01-17 – 2013-01-29 (×35): 100 mg via ORAL
  Filled 2013-01-17 (×33): qty 2

## 2013-01-17 NOTE — ED Notes (Signed)
Report given to Sharon RN.

## 2013-01-17 NOTE — ED Provider Notes (Signed)
CSN: [id number]     Arrival date & time 01/17/13  1136 History   First MD Initiated Contact with Patient 01/17/13 1311     Chief Complaint  Patient presents with  . V70.1   (Consider location/radiation/quality/duration/timing/severity/associated sxs/prior Treatment) Patient is a 62 y.o. male presenting with altered mental status. The history is provided by the nursing home (the pt was brought in by police for threatening to kill someone at the nh.  he states he was told he would stay the rest of his life at a [facility]).  Altered Mental Status Presenting symptoms: behavior changes   Severity:  Moderate Most recent episode:  Today Episode history:  Multiple Timing:  Constant Progression:  Worsening Chronicity:  Recurrent Context: dementia   Context: not alcohol use   Associated symptoms: agitation   Associated symptoms: no abdominal pain, no hallucinations, no headaches, no rash and no seizures     Past Medical History  Diagnosis Date  . Parkinson disease   . Hypertension   . Hypercholesterolemia   . Schizoaffective disorder   . Mild mental retardation   . Diabetes mellitus   . OA (osteoarthritis)   . COPD (chronic obstructive pulmonary disease)   . GERD (gastroesophageal reflux disease)   . Migraines    Past Surgical History  Procedure Laterality Date  . Vein ligation and stripping    . Appendectomy     Family History  Problem Relation Age of Onset  . Heart attack      Mother, deceased  . Cancer Brother     unsure what type   History  Substance Use Topics  . Smoking status: Current Every Day Smoker -- 2.00 packs/day for 54 years    Types: Cigarettes  . Smokeless tobacco: Not on file  . Alcohol Use: No    Review of Systems  Constitutional: Negative for appetite change and fatigue.  HENT: Negative for congestion, ear discharge and sinus pressure.   Eyes: Negative for discharge.  Respiratory: Negative for cough.   Cardiovascular: Negative  for chest pain.  Gastrointestinal: Negative for abdominal pain and diarrhea.  Genitourinary: Negative for frequency and hematuria.  Musculoskeletal: Negative for back pain.  Skin: Negative for rash.  Neurological: Negative for seizures and headaches.  Psychiatric/Behavioral: Positive for agitation. Negative for hallucinations.       Homicidal    Allergies  Review of patient's allergies indicates no known allergies.  Home Medications   Current Outpatient Rx  Name  Route  Sig  Dispense  Refill  . aspirin 81 MG chewable tablet   Oral   Chew 81 mg by mouth daily.         . budesonide-formoterol (SYMBICORT) 80-4.5 MCG/ACT inhaler   Inhalation   Inhale 2 puffs into the lungs 2 (two) times daily.         [name] Kitchen docusate sodium (COLACE) 100 MG capsule   Oral   Take 100 mg by mouth every morning.         . fenofibrate 160 MG tablet   Oral   Take 160 mg by mouth at bedtime.          . gabapentin (NEURONTIN) 300 MG capsule   Oral   Take 300 mg by mouth at bedtime.         . insulin aspart (NOVOLOG) 100 UNIT/ML injection   Subcutaneous   Inject 14 Units into the skin 3 (three) times daily before meals.         [name] Kitchen  insulin detemir (LEVEMIR) 100 UNIT/ML injection   Subcutaneous   Inject 65 Units into the skin at bedtime.         [name] Kitchen lisinopril (PRINIVIL,ZESTRIL) 5 MG tablet   Oral   Take 5 mg by mouth every morning.         . metFORMIN (GLUCOPHAGE) 1000 MG tablet   Oral   Take 1,000 mg by mouth 2 (two) times daily.         . metoprolol succinate (TOPROL-XL) 25 MG 24 hr tablet   Oral   Take 25 mg by mouth every morning.         . polyethylene glycol powder (GLYCOLAX/MIRALAX) powder   Oral   Take 17 g by mouth every morning.         . pravastatin (PRAVACHOL) 40 MG tablet   Oral   Take 40 mg by mouth at bedtime.         . predniSONE (DELTASONE) 10 MG tablet   Oral   Take 10 mg by mouth daily with breakfast.         . ranitidine (ZANTAC) 300 MG  tablet   Oral   Take 300 mg by mouth every morning.         . risperiDONE (RISPERDAL) 1 MG tablet   Oral   Take 1 mg by mouth 2 (two) times daily.         [name] Kitchen tiotropium (SPIRIVA) 18 MCG inhalation capsule   Inhalation   Place 18 mcg into inhaler and inhale daily.         . traZODone (DESYREL) 100 MG tablet   Oral   Take 100 mg by mouth 3 (three) times daily.         . cetirizine (ZYRTEC) 10 MG tablet   Oral   Take 10 mg by mouth every morning.         . cyanocobalamin (,VITAMIN B-12,) 1000 MCG/ML injection   Intramuscular   Inject 1,000 mcg into the muscle every 30 (thirty) days.         [name] Kitchen ipratropium-albuterol (DUONEB) 0.5-2.5 (3) MG/3ML SOLN   Nebulization   Take 3 mLs by nebulization every 6 (six) hours as needed (for shortness of breath and/or wheezing).          BP 136/71  Pulse 98  Temp(Src) 98.3 F (36.8 C) (Oral)  Resp 20  SpO2 98% Physical Exam  Constitutional: He is oriented to person, place, and time. He appears well-developed.  HENT:  Head: Normocephalic.  Eyes: Conjunctivae and EOM are normal. No scleral icterus.  Neck: Neck supple. No thyromegaly present.  Cardiovascular: Normal rate and regular rhythm.  Exam reveals no gallop and no friction rub.   No murmur heard. Pulmonary/Chest: No stridor. He has no wheezes. He has no rales. He exhibits no tenderness.  Abdominal: He exhibits no distension. There is no tenderness. There is no rebound.  Musculoskeletal: Normal range of motion. He exhibits no edema.  Lymphadenopathy:    He has no cervical adenopathy.  Neurological: He is oriented to person, place, and time. He exhibits normal muscle tone. Coordination normal.  Skin: No rash noted. No erythema.  Psychiatric:  Pt homicidal.      ED Course  Procedures (including critical care time) Labs Review Labs Reviewed  BASIC METABOLIC PANEL - Abnormal; Notable for the following:    Glucose, Bld 210 (*)    All other components within normal  limits  CBC WITH DIFFERENTIAL  ETHANOL  URINE  RAPID DRUG SCREEN (HOSP PERFORMED)   Imaging Review Dg Chest Port 1 View  01/17/2013   CLINICAL DATA:  Shortness of breath, wheezing  EXAM: PORTABLE CHEST - 1 VIEW  COMPARISON:  11/29/2012; 02/23/2010; 11/02/2009  FINDINGS: Grossly in borderline enlarged cardiac silhouette and mediastinal contours with apparent difference is likely attributable to AP projection. The lungs appear hyperexpanded with flattening of the bilateral hemidiaphragms and mild diffuse slightly nodular thickening of the pulmonary interstitium. Grossly unchanged minimal left basilar linear heterogeneous opacities, likely atelectasis. No new focal airspace opacities. No pleural effusion or pneumothorax. No evidence of edema. Grossly unchanged bones.  IMPRESSION: Mild lung hyperexpansion of bronchitic change without definite acute cardiopulmonary disease on this AP portable examination.   Electronically Signed   By: Simonne Come M.D.   On: 01/17/2013 14:02    EKG Interpretation   None       MDM  homicidal    Benny Lennert, MD 01/17/13 1547

## 2013-01-17 NOTE — ED Notes (Signed)
Pt resting in bed. Officers at bsd with pt. Pt cooperative at this time.

## 2013-01-17 NOTE — ED Notes (Signed)
Gerald Cruz at [facility] and informed that a TTS consult is scheduled for 1535.  Monitor has been placed in the room.  Nurse informed.
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
## 2013-01-17 NOTE — ED Notes (Signed)
Pt refusing to have blood drawn or given urine sample at this time. Dr. Estell Harpin aware and in room to assess pt.

## 2013-01-17 NOTE — ED Notes (Signed)
Police came to serve IVC paper that was done by sending facility. Pt began belligerent and uncooperative.

## 2013-01-17 NOTE — ED Notes (Signed)
Pt requesting breathing treatment. Dr. Effie Shy gave order to change albuterol to q 4hr

## 2013-01-17 NOTE — BH Assessment (Signed)
[facility] Assessment Progress Note  At 15:20 I spoke to EDP Dr Estell Harpin in anticipation of TTS assessment scheduled for 15:35.  Doylene Canning, MA Triage Specialist 01/17/2013 @ 18:07

## 2013-01-18 LAB — GLUCOSE, CAPILLARY
Glucose-Capillary: 231 mg/dL — ABNORMAL HIGH (ref 70–99)
Glucose-Capillary: 238 mg/dL — ABNORMAL HIGH (ref 70–99)

## 2013-01-18 NOTE — BH Assessment (Signed)
Pt referred to [facility] for assistance with placement. Writer received a call from Delia Chimes with [facility] stating she will visit patient approx 3pm today to conduct a interview. Notified patient's nurse-Christie and LCSW-Anne of updates.

## 2013-01-18 NOTE — Progress Notes (Signed)
This Clinical research associate received a telephone call from [facility] in reference to placement in regards to the above mentioned patient. Writer was informed that the hospital does not have a program that supports patients who are (MR). Patient does not meet criteria for placement.

## 2013-01-18 NOTE — Progress Notes (Deleted)
Pt's referral was faxed to the following facilities: [facility], [facility], [facility], [facility], [facility], [facility], [facility], [facility], [facility], [facility], [facility] and [facility] seeking placement.

## 2013-01-18 NOTE — Clinical Social Work Note (Signed)
Clinical Social Work Department BRIEF PSYCHOSOCIAL ASSESSMENT 01/18/2013  Patient:  Gerald Cruz, Gerald Cruz     Account Number:  [id number]     Admit date:  01/17/2013  Clinical Social Worker:  Santa Genera, CLINICAL SOCIAL WORKER  Date/Time:  01/18/2013 11:00 AM  Referred by:  CSW  Date Referred:  01/18/2013 Referred for  ALF Placement  Behavioral Health Issues   Other Referral:   Interview type:  Other - See comment Other interview type:   Spoke w [facility] re care coordinator and [facility] assessment team    PSYCHOSOCIAL DATA Living Status:  FACILITY Admitted from facility:  [facility] Level of care:  Assisted Living Primary support name:  Gerald Cruz Primary support relationship to patient:  NONE Degree of support Cruz:   Guardian from [facility]    CURRENT CONCERNS Current Concerns  Post-Acute Placement   Other Concerns:    SOCIAL WORK ASSESSMENT / PLAN CSW reviewed chart and spoke w [facility] assessment team re expected patient disposition.  Per chart, patient has been at [facility] but facility will not take him back due to behavioral issues.  Patient is currently IVC and [facility] team is seeking inpatient bed for patient, bed search has been unsuccessful at this point.  Called [facility] to determine if patient has care coordinator, Gerald told patient is assigned to Gerald Cruz [phone]).  Gerald Cruz, Gerald told Gerald Cruz is care coordinator for day. Per Gerald Cruz [phone] - [facility] MR/DD/ID intake coordinator) patient has care coordinator but is not on registry for MR services because [facility] needs updated psychological (says they have results of testing from 1999).  Says she will place him tentatively on wait list for services but will not be eligible until updated testing is obtained.    Spoke w Gerald Cruz, [facility]F staff, to determine if patient has PASARR - obtained PASARR.  Gerald Cruz says patient has been there approx 2  months, walks independently (no walker or assistive device), requires limited assist with bathing, dressing and toileting.  Facility monitors BP and pulse daily, wears O2 at night and receives nightly nebulizers.  Gerald Cruz says facility has given patient 30 day eviction notice, indicating that they will not take him back.  Does not know date this Gerald given to patient.  Says Gerald Cruz will call CSW w date and can provide copy of notice.    CSW completed FL2 and placed signature page on shadow chart in ED in case alternative ALF placement needs to be sought for this patient .  Provided contact information for [facility] resources and Gerald Cruz to [facility] assessment team.   Assessment/plan status:  Other - See comment Other assessment/ plan:   [facility] SW will stand by at this point, [facility] assessment team is coordinating placement search   Information/referral to community resources:   [place]  [facility] care coordination resources.    PATIENT'S/FAMILY'S RESPONSE TO PLAN OF CARE: Unable to assess patient response, CSW assisting assessment team w resources only.       Santa Genera, LCSW Clinical Social Worker [phone])

## 2013-01-18 NOTE — Progress Notes (Signed)
This Clinical research associate received a telephone call from [facility] in reference to placement for the above mentioned patient. This Clinical research associate was informed that patient was declined based on not meeting entrance criteria.
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
## 2013-01-18 NOTE — ED Notes (Signed)
Patient denies any HI or Si now. Patient states "I have a child like mind and I have tantrums but I would never want to hurt myself or anyone else." Patients states that what happened at the group home was a miscommunication. Patient states "I told them I need another oxygen tank that the one I had wasn't working right and they asked me what I was going to do with mine and I said I don't know blow it up maybe."

## 2013-01-18 NOTE — [facility] Counselor (Signed)
Pt has been declined by [facility] d/t MR.  Evette Cristal, [place] Assessment Counselor

## 2013-01-18 NOTE — ED Notes (Signed)
Patient in room hollering and stating "Ya'll do what you got to do. I'm not taking my medicine. I want you to get me out of here." Security at bedside.

## 2013-01-18 NOTE — ED Notes (Signed)
Pt wants me to call his guardian so he can get out of here.  States he is going to come back and this hospital is going to go up in smoke.  States he knows this hospital real good and he can sneak in.

## 2013-01-18 NOTE — Progress Notes (Signed)
This Clinical research associate received a telephone call from [facility] in reference to placement in regards to the above mentioned patient. Writer was informed that the hospital does not have a program that supports patients who are (MR). Patient does not meet criteria for placement.

## 2013-01-18 NOTE — Progress Notes (Signed)
Pt's referral was faxed to the following facility: [facility] seeking placement.

## 2013-01-18 NOTE — Progress Notes (Signed)
This Clinical research associate received a phone call from [facility] in regards to the referral information that was faxed over for placement of the above mentioned patient. This Clinical research associate was informed that the hospital was currently at capacity and that there were no beds available.

## 2013-01-18 NOTE — ED Notes (Signed)
Resting quietly on stretcher. Does not want [place] on but within reach if he needs it.

## 2013-01-18 NOTE — Clinical Social Work Note (Signed)
Patient has care coordinator at [facility] - Raquel James [phone])  Santa Genera, LCSW Clinical Social Worker [phone])

## 2013-01-18 NOTE — Progress Notes (Signed)
Referrals for this pt. was faxed to the following facilities: [facility], [facility], [facility], [facility], [facility], [facility], [facility], [facility]. [facility], [facility], [facility], and [facility] seeking placement.

## 2013-01-18 NOTE — ED Notes (Signed)
[facility] called and states [facility] start will be here to assess pt around 3pm to help with placement.
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
## 2013-01-18 NOTE — ED Notes (Addendum)
In talking with pt.  Pt  States  He is upset with this hospital and the police department.  States what happened here yesterday was not good.

## 2013-01-18 NOTE — Progress Notes (Signed)
Inpatient Diabetes Program Recommendations  AACE/ADA: New Consensus Statement on Inpatient Glycemic Control (2013)  Target Ranges:  Prepandial:   less than 140 mg/dL      Peak postprandial:   less than 180 mg/dL (1-2 hours)      Critically ill patients:  140 - 180 mg/dL   Results for Gerald Cruz, Gerald Cruz (MRN [id number]) as of 01/18/2013 08:36  Ref. Range 01/17/2013 17:37 01/17/2013 20:52 01/18/2013 07:45  Glucose-Capillary Latest Range: 70-99 mg/dL 147 (H) 829 (H) 562 (H)    Inpatient Diabetes Program Recommendations Correction (SSI): Please consider ordering Novolog moderate correction ACHS (in addition to Novolog 14 units TID meal coverage) while in ED holding for bed placement. Diet: Please change diet from regular to Carb Modified Diabetic diet.  Thanks, Orlando Penner, RN, MSN, CCRN Diabetes Coordinator Inpatient Diabetes Program [phone] (Team Pager) [phone] ([facility] office) [phone] [facility] office)

## 2013-01-18 NOTE — Clinical Social Work Note (Signed)
CSW spoke with Idalia Needle at [facility] and advised her that EDP feels pt needs inpatient treatment and [facility] recommended considering [facility] as they have had success there with pt's who have MR. Received call from [facility] stating that 30 day notice was given to guardian yesterday. Idalia Needle confirmed this. CSW received fax from [facility] of psychological eval completed in 2012. This was placed with pt's paperwork in ED.   Derenda Fennel, [place] [phone]

## 2013-01-18 NOTE — BHH Counselor (Addendum)
1:30 pm. TC from   Writer called [facility] and spoke w/ Myrla Halsted [phone]. Raquel James isn't in office today. Misty Stanley transferred Clinical research associate to [employer] as she is in office, Cherlyn Cushing [phone]. Writer was told by Merita Norton that Jillyn Hidden is working w/ pt in terms of possible housing in an apartment. Writer then called and spoke w/ [facility]'s Durwin Reges [phone]. Lanora Manis reports that she is placing pt on "registry of needs" which is the wait list for services for ID patients funded by [employer]  Education officer, environmental for [employer] from [employer] [phone].   TC from [place] CSW re: pt disposition. Thurston Hole sts that she is in touch w/ pt's ALF who states they have given pt an eviction notice. TTS will contact pt's guardian to find out if pt was indeed given an eviction notice and whether guardian has a recent psych eval for pt.      Evette Cristal, [place] Assessment Counselor

## 2013-01-18 NOTE — Clinical Social Work Psychosocial (Signed)
    Clinical Social Work Department BRIEF PSYCHOSOCIAL ASSESSMENT 01/18/2013  Patient:  Gerald Cruz, Gerald Cruz     Account Number:  [id number]     Admit date:  01/17/2013  Clinical Social Worker:  Santa Genera, CLINICAL SOCIAL WORKER  Date/Time:  01/18/2013 11:00 AM  Referred by:  CSW  Date Referred:  01/18/2013 Referred for  ALF Placement  Behavioral Health Issues   Other Referral:   Interview type:  Other - See comment Other interview type:   Spoke w [facility] re care coordinator and [facility] assessment team    PSYCHOSOCIAL DATA Living Status:  FACILITY Admitted from facility:  [facility] Level of care:  Assisted Living Primary support name:  [name] Primary support relationship to patient:  NONE Degree of support available:   Guardian from [facility] [employer]    CURRENT CONCERNS Current Concerns  Post-Acute Placement   Other Concerns:    SOCIAL WORK ASSESSMENT / PLAN CSW reviewed chart and spoke w [facility] assessment team re expected patient disposition.  Per chart, patient has been at [facility] but facility will not take him back due to behavioral issues.  Patient is currently IVC and [facility] team is seeking inpatient bed for patient, bed search has been unsuccessful at this point.  Called [facility] to determine if patient has care coordinator, was told patient is assigned to Raquel James [phone]).  Jeanella Craze was not available, was told Myrla Halsted is care coordinator for day. Per Vena Rua [phone] - [facility] MR/DD/ID intake coordinator) patient has care coordinator but is not on registry for MR services because [facility] needs updated psychological (says they have results of testing from 1999).  Says she will place him tentatively on wait list for services but will not be eligible until updated testing is obtained.    Spoke w Bennie Hind, [facility]F staff, to determine if patient has PASARR - obtained PASARR.  [facility] says patient has been there approx  2 months, walks independently (no walker or assistive device), requires limited assist with bathing, dressing and toileting.  Facility monitors BP and pulse daily, wears O2 at night and receives nightly nebulizers.  [facility] says facility has given patient 30 day eviction notice, indicating that they will not take him back.  Does not know date this was given to patient.  Says Lenice Pressman will call CSW w date and can provide copy of notice.    CSW completed FL2 and placed signature page on shadow chart in ED in case alternative ALF placement needs to be sought for this patient .  Provided contact information for [facility] resources and [facility] to [facility] assessment team.   Assessment/plan status:  Other - See comment Other assessment/ plan:   [facility] SW will stand by at this point, [facility] assessment team is coordinating placement search   Information/referral to community resources:   [facility]  [facility] care coordination resources.    PATIENT'S/FAMILY'S RESPONSE TO PLAN OF CARE: Unable to assess patient response, CSW assisting assessment team w resources only.        Santa Genera, LCSW Clinical Social Worker [phone])
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
## 2013-01-18 NOTE — BH Assessment (Signed)
Received a call from Kizzie Fantasia, [facility] Liason Coordinator. She suggested referring patient to [facility] stating they will take developmental disabled patients. 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 Writer will review patient's chart to try this information of contact patient's supports, guardians, staff from previous residence, etc.

## 2013-01-19 LAB — GLUCOSE, CAPILLARY: Glucose-Capillary: 180 mg/dL — ABNORMAL HIGH (ref 70–99)

## 2013-01-19 MED ORDER — POLYETHYLENE GLYCOL 3350 17 G PO PACK
PACK | ORAL | Status: AC
  Filled 2013-01-19: qty 1

## 2013-01-19 MED ORDER — LORATADINE 10 MG PO TABS
ORAL_TABLET | ORAL | Status: AC
  Filled 2013-01-19: qty 1

## 2013-01-19 MED ORDER — ASPIRIN 81 MG PO CHEW
CHEWABLE_TABLET | ORAL | Status: AC
  Filled 2013-01-19: qty 1

## 2013-01-19 MED ORDER — FAMOTIDINE 20 MG PO TABS
ORAL_TABLET | ORAL | Status: AC
  Filled 2013-01-19: qty 2

## 2013-01-19 NOTE — ED Notes (Signed)
Escorted patient to shower room and stayed with patient during shower.  While bathing patient frequently stated unprovoked "I don't care about my life and nobody else's, I'll blow myself up with them to get back them.  You know what I mean?"
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
## 2013-01-19 NOTE — Clinical Social Work Note (Signed)
CSW spoke with Ava at [facility] and passed information along from Alden at [place] again encouraging [facility] referral. Ava is aware.  Derenda Fennel, [place] [phone]

## 2013-01-19 NOTE — ED Notes (Signed)
Pt making racial remarks and threats. States he is "tired of being mistreated" and he "isn't gonna riot like those black people do", he is "gonna just kill other people and himself" and "these white people ain't no good either".

## 2013-01-19 NOTE — ED Notes (Signed)
Spoke with Bruce from [facility]. Confirmed to [facility] that pt was evaluated by [facility] yesterday, but unsure of assessment findings.

## 2013-01-19 NOTE — ED Notes (Signed)
Pt given meal tray. Pt up and around in his room talking to sitter as well.

## 2013-01-19 NOTE — Clinical Social Work Note (Signed)
CSW spoke with Ava at [facility] for update. Continued search for inpatient treatment bed. CSW spoke with Myrla Halsted at [place], but was transferred to Kizzie Fantasia [phone]) who was more involved yesterday. Left voicemail and awaiting return call. Notified RN.   Derenda Fennel, [place] [phone]

## 2013-01-19 NOTE — Progress Notes (Signed)
Pt is currently being reviewed by [facility] - Attn: Noralee Chars - Attn: Lupita Leash; [facility] - Attn: Annice Pih. Earlene Plater asked that another intake packet be faxed 01/20/13 since they could not locate the one previously sent.  Pt was declined by [facility] and [facility] due to MR.  [facility] declined stating that pt required long term care.  Messages were left with [facility], [facility], and [facility]

## 2013-01-19 NOTE — Progress Notes (Signed)
Gerald Cruz, MHT spoke with attending nurse Tiffany who reports that patient was evaluated on 01/18/13 by [facility] but there were no assessment findings recorded. Patient has been declined at [facility] due to exclusionary criteria. Writer notified AVA, TTS counselor at [facility].

## 2013-01-19 NOTE — ED Notes (Signed)
Per Ava from [facility]-pt was declined at [facility]. BH is waiting for [facility] start assessment in order to refer pt to [facility].

## 2013-01-20 LAB — GLUCOSE, CAPILLARY
Glucose-Capillary: 171 mg/dL — ABNORMAL HIGH (ref 70–99)
Glucose-Capillary: 172 mg/dL — ABNORMAL HIGH (ref 70–99)
Glucose-Capillary: 233 mg/dL — ABNORMAL HIGH (ref 70–99)

## 2013-01-20 MED ORDER — FAMOTIDINE 20 MG PO TABS
ORAL_TABLET | ORAL | Status: AC
  Filled 2013-01-20: qty 2

## 2013-01-20 MED ORDER — LORATADINE 10 MG PO TABS
ORAL_TABLET | ORAL | Status: AC
  Filled 2013-01-20: qty 1

## 2013-01-20 MED ORDER — ASPIRIN 81 MG PO CHEW
CHEWABLE_TABLET | ORAL | Status: AC
  Filled 2013-01-20: qty 1

## 2013-01-20 MED ORDER — POLYETHYLENE GLYCOL 3350 17 G PO PACK
PACK | ORAL | Status: AC
  Filled 2013-01-20: qty 1

## 2013-01-20 NOTE — Progress Notes (Signed)
Pt has been declined by the following hospitals:  [facility], [facility], [name] - at capacity; [facility] - MR; [facility]- "suited for out patient treatment".  Pt packet being reviewed by [facility], [facility], [facility] and [facility].  As per Judeth Cornfield at [facility] "not accepting pts today; call back on Friday, December 26. Contact is Lorrin Jackson at [phone].
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
## 2013-01-20 NOTE — ED Notes (Signed)
Pt used the phone to call his brother to come and visit him during visiting hours. Brother stated he couldn't come today.

## 2013-01-20 NOTE — ED Notes (Signed)
Ambulatory to bathroom - returned to room and became angry that he could not close his door- sitter continues to observe from doorway.  Patient returned to bed.
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
## 2013-01-20 NOTE — ED Notes (Signed)
RN attempted to cal pt's brother, Mardelle Matte at [phone]. No answer and voicemail had not been set up to receive messages.

## 2013-01-20 NOTE — ED Notes (Signed)
Patient had refused neb treatments earlier - now feels short of breath and would like his nebulizer treatment.  Remains loud and negative then calms and cooperates

## 2013-01-20 NOTE — ED Notes (Signed)
Patient up to restroom with sitter at side

## 2013-01-20 NOTE — ED Notes (Signed)
Pt. had a bath.
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
## 2013-01-21 LAB — GLUCOSE, CAPILLARY
Glucose-Capillary: 176 mg/dL — ABNORMAL HIGH (ref 70–99)
Glucose-Capillary: 231 mg/dL — ABNORMAL HIGH (ref 70–99)
Glucose-Capillary: 232 mg/dL — ABNORMAL HIGH (ref 70–99)

## 2013-01-21 MED ORDER — TRAZODONE HCL 50 MG PO TABS
ORAL_TABLET | ORAL | Status: AC
  Filled 2013-01-21: qty 2

## 2013-01-21 MED ORDER — ZIPRASIDONE MESYLATE 20 MG IM SOLR
20.0000 mg | Freq: Once | INTRAMUSCULAR | Status: AC
Start: 2013-01-21 — End: 2013-01-21
  Administered 2013-01-21: 20 mg via INTRAMUSCULAR
  Filled 2013-01-21: qty 20

## 2013-01-21 MED ORDER — STERILE WATER FOR INJECTION IJ SOLN
INTRAMUSCULAR | Status: AC
  Administered 2013-01-21: 04:00:00
  Filled 2013-01-21: qty 10

## 2013-01-21 NOTE — BH Assessment (Signed)
Assessment Note   Gerald Cruz spoke to patient's nurse Gerald Cruz regarding patient's mental health status today. Pt apparently told his nurse Gerald Cruz today that he "Feels a lot better and doesn't want to hurt himself or anyone else". "Pt states he wants to go back to where he lives".   Writer informed patient's nurse Gerald Cruz that a telepsych would be initiated and completed in order to possibly discharge this patient from [facility]. Writer contacted Gerald Means, NP but she is unavailable. Writer contacted Gerald Cruz but she is unavailable as she is working on patient's at [employer] and her shift is ending soon. Dr. Lucianne Cruz contacted but she is also unavailable. Writer discussed with AC-Shalita and we both agreed to have patient re-evaluated by an available extender in the am.   Disposition as of 01/21/2013 @ 1549: Pending evaluation by an extender in the am 01/22/13. Patient stating he is no longer suicidal, contracts for safety, and requesting to discharge home.   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  Parkinson disease    .  Hypertension    .  Hypercholesterolemia    .  Schizoaffective disorder    .  Mild mental retardation    .  Diabetes mellitus    .  OA (osteoarthritis)    .  COPD (chronic obstructive pulmonary disease)    .  GERD (gastroesophageal reflux disease)    .  Migraines     Axis IV: problems related to social environment and problems with primary support group  Axis V: GAF = 35   Past Medical History:  Past Medical History  Diagnosis Date  . Parkinson disease   . Hypertension   . Hypercholesterolemia   . Schizoaffective disorder   . Mild mental retardation   . Diabetes mellitus   . OA (osteoarthritis)   . COPD (chronic obstructive pulmonary disease)   . GERD (gastroesophageal reflux disease)   . Migraines     Past Surgical History  Procedure Laterality Date  . Vein  ligation and stripping    . Appendectomy      Family History:  Family History  Problem Relation Age of Onset  . Heart attack      Mother, deceased  . Cancer Brother     unsure what type    Social History:  reports that he has been smoking Cigarettes.  He has a 108 pack-year smoking history. He does not have any smokeless tobacco history on file. He reports that he does not drink alcohol or use illicit drugs.  Additional Social History:  Alcohol / Drug Use Pain Medications: Denies Prescriptions: Denies Over the Counter: Denies History of alcohol / drug use?:  (Chart shows history of unspecified substance abuse)  CIWA: CIWA-Ar BP: 136/73 mmHg Pulse Rate: 117 COWS:    Allergies: No Known Allergies  Home Medications:  (Not in a hospital admission)  OB/GYN Status:  No LMP for male patient.  General Assessment Data Location of Assessment: [facility] Is this a Tele or Face-to-Face Assessment?: Tele Assessment Is this an Initial Assessment or a Re-assessment for this encounter?: Initial Assessment Living Arrangements: Other (Comment) [facility]) Can pt return to current living arrangement?: No Admission Status: Voluntary Is patient capable of signing voluntary admission?: No Transfer from: Acute Hospital Referral Source: Other [facility] ED)  Medical Screening Exam [facility] Walk-[facility] ONLY) Medical Exam  completed: No Reason for MSE not completed: Other: (Medically cleared @ [facility])  [name] Crisis Care Plan Living Arrangements: Other (Comment) [facility]) Name of Psychiatrist: [facility] Name of Therapist: [facility]  Education Status Is patient currently in school?: No  Risk to self Suicidal Ideation: No (Denies but tried to ignite O2 tank to kill others.) Suicidal Intent: No Is patient at risk for suicide?: No Suicidal Plan?: No Access to Cruz: No What has been your use of drugs/alcohol within the last 12  months?: Chart shows Hx of unspecified substance abuse; none current Previous Attempts/Gestures: No (None reported) How many times?: 0 Other Self Harm Risks: Dangerous impulsive behavior, including attempt to ignite O2 tank Triggers for Past Attempts: Other (Comment) (Not applicable) Intentional Self Injurious Behavior: None Family Suicide History: Yes (Half-sister: failed attempts; Brother, Sister: MI NOS) Recent stressful life event(s): Other (Comment) (Conflict w/ group home staff) Persecutory voices/beliefs?: Yes (Believes he is being mistreated for racial reasons.) Depression: Yes Depression Symptoms: Insomnia;Tearfulness;Isolating;Fatigue;Guilt;Loss of interest in usual pleasures;Feeling worthless/self pity;Feeling angry/irritable (Hopelessness) Substance abuse history and/or treatment for substance abuse?: Yes (Chart shows Hx of unspecified substance abuse; none current) Suicide prevention information given to non-admitted patients: Not applicable (Tele-assessment: unable to provide)  Risk to Others Homicidal Ideation: Yes-Currently Present (Denies saying, "I love people;"  petition is more credible) Thoughts of Harm to Others: Yes-Currently Present Comment - Thoughts of Harm to Others: Locked self in bathroom, wrapped toilet paper around O2 tank, & tried to ignite it with cigarette lighter. Current Homicidal Intent: Yes-Currently Present Current Homicidal Plan: Yes-Currently Present Describe Current Homicidal Plan: Tied to ignite O2 tank w/ toilet paper & cigarette lighter. Access to Homicidal Cruz: Yes Describe Access to Homicidal Cruz: O2 tank, cigarette lighter, combustibles Identified Victim: Staff & residents at group home History of harm to others?: No (None reported) Assessment of Violence: In past 6-12 months (Threatening @ group home, cooperative w/ assessment) Violent Behavior Description: Threatening @ group home, cooperative w/ assessment Does patient have access to  weapons?: Yes (Comment) (O2 tank, cigarette lighter, combustibles; no firearms) Criminal Charges Pending?: No Does patient have a court date: No  Psychosis Hallucinations: None noted Delusions: Persecutory;Grandiose (Irrational racist beliefs, believes he is in the [employer])  Mental Status Report Appear/Hygiene: Other (Comment) (Paper scrubs) Eye Contact: Good Motor Activity: Agitation Speech: Other (Comment) (Unremarkable) Level of Consciousness: Alert Mood: Sad Affect: Appropriate to circumstance Anxiety Level: Panic Attacks Panic attack frequency: Daily Most recent panic attack: Today (01/17/2013) Thought Processes: Coherent;Relevant (Fixated on being hospitalized) Judgement: Impaired Orientation: Person;Place;Situation (Time: oriented only to month, time of day) Obsessive Compulsive Thoughts/Behaviors: None  Cognitive Functioning Concentration: Decreased Memory: Recent Intact;Remote Intact IQ: Below Average Level of Function: IQ tested at 21 in 2012; 57 in 1999 Insight: Poor Impulse Control: Poor Appetite: Fair (Varies) Weight Loss:  (Varies) Weight Gain:  (Varies) Sleep: Decreased Total Hours of Sleep:  ("Not many" for the past 2 months) Vegetative Symptoms: None  ADLScreening [facility] Assessment Cruz) Patient's cognitive ability adequate to safely complete daily activities?: Yes Patient able to express need for assistance with ADLs?: Yes Independently performs ADLs?: Yes (appropriate for developmental age)  Prior Inpatient Therapy Prior Inpatient Therapy: Yes Prior Therapy Dates: 04/2003; 01/2000; 04/1999; 12/1998: [facility] Prior Therapy Facilty/Provider(s): "Quite a few times" @ "[facility]"  Prior Outpatient Therapy Prior Outpatient Therapy: Yes Prior Therapy Dates: "All my life" [facility], then [facility]  ADL Screening (condition at time of admission) Patient's  cognitive ability adequate to safely complete daily activities?: Yes Is the patient  deaf or have difficulty hearing?: No Does the patient have difficulty seeing, even when wearing glasses/contacts?: No Does the patient have difficulty concentrating, remembering, or making decisions?: No Patient able to express need for assistance with ADLs?: Yes Does the patient have difficulty dressing or bathing?: No Independently performs ADLs?: Yes (appropriate for developmental age) Does the patient have difficulty walking or climbing stairs?: No Weakness of Legs: None Weakness of Arms/Hands: None  Home Assistive Devices/Equipment Home Assistive Devices/Equipment: None    Abuse/Neglect Assessment (Assessment to be complete while patient is alone) Physical Abuse: Denies Verbal Abuse: Denies Sexual Abuse: Denies Exploitation of patient/patient's resources: Denies Self-Neglect: Denies Values / Beliefs Cultural Requests During Hospitalization: None Spiritual Requests During Hospitalization: None   Advance Directives (For Healthcare) Advance Directive: Patient does not have advance directive (Pt is an adult with a legal guardian.) Pre-existing out of facility DNR order (yellow form or pink MOST form): No Nutrition Screen- [facility] Adult/[facility] Patient's home diet: Carb modified  Additional Information 1:1 In Past 12 Months?: No CIRT Risk: Yes Elopement Risk: No Does patient have medical clearance?: Yes     Disposition:  Pending evaluation by an extender in the am 01/22/13. Patient stating he is no longer suicidal, contracts for safety, and requesting to discharge home.   On Site Evaluation by:   Reviewed with Physician:    Melynda Ripple [facility] 01/21/2013 3:44 PM    Additional Notes from Previous Assessment completed by Janice Coffin 01/17/2013: Gerald Cruz is a 62 y.o. widowed white male. He presents at [facility] ED under IVC initiated by staff at [facility]. At the time of this assessment, EDP Dr Estell Harpin has already performed a QPE upholding the petition. Pt is  accompanied by [employer]. He is handcuffed to the bed. Pt denies everything before being asked about the specific details of the petition, and his reality testing appears to be impaired. This being the case, pt is an unreliable historian. Per the petition:  "The respondent was upset because he was told to come inside from smoking at 2 am this morning and threatened to blow up his oxygen tank. He rolled up a piece of paper and locked himself in the room not allowing the staff to enter the room. The law enforcement officer refused to take his lighter once he came from the room. He called the staff racist and stated that the police could not do anything to him. The respondent has not taken his medications since last night or eaten anything stating that he wasn't going to let any black people give him anything. Based on these facts, the respondent needs to be evaluated."  After speaking to the pt I spoke to [employer], Lenice Pressman, manager of [facility], and Ceredo Fears, pt's court appointed guardian.  Stressors:  Pt reports that he has lived in the current group home for the past 2 months. Previously he lived at [facility] in [place]. He fluctuates between saying that the other group home was terrible and saying that he wants to return to it. He believes that he is subjected to discrimination at [facility] because he is caucasian, attributing this to recent racial tension related to police shootings of African Americans. He states that all whites fear blacks because of this. Ms Amie Critchley, the petitioner, states that pt starts accusing her staff of racism any time he is not allowed  to be first in line for meals, despite the fact that the other residents are caucasian. Due to race related paranoia, pt has been refusing to take medications or to eat since 20:00 yesterday. Pt also reports that he is sad because, "I never had a good Christmas," and the holiday takes  place later this week.  Lethality:  Suicidality: Pt denies SI currently or at any time in the past. He denies any history of suicide attempts or of self mutilation. However, Officer Altizer reports that in the ED pt said that he wished he would die. Pt also tried to ignite his O2 tank while locked in the bathroom by wrapping toilet paper around it and using one of the four cigarette lighters that he had in his possession. While he made this gesture in the context of reported HI, the consequences were also potentially lethal to the pt. Pt denies that this event took place, but Ms. Blackwell's account appears to be more credible. Pt endorses depressed mood with symptoms noted in the "risk to self" assessment below.  Homicidality: As noted above, pt denies HI, stating, "I love people." However, the accounts of Ms Amie Critchley and Radiographer, therapeutic concur that pt was belligerent earlier today. This being the case, the account of pt attempting to blow up his O2 tank seems more credible than the pt's denial. Pt does not have access to firearms and is not known to be facing any legal problems. He is generally cooperative with the assessment, but is fixated on being admitted to a psychiatric facility, and frequently tries to foreclose on the assessment process, wanting only to be admitted.  Psychosis: Pt denies having problems with hallucinations, and does not appear to be responding to internal stimuli. As noted above, he appears to be experiencing paranoid delusions related to racist themes, to the point that he refuses food and medication from his primary caregivers. He reports that the staff at [facility] state, "We hate whites. Black power! Black power!" This is despite Ms Blackwell's assertion that all the other residents at the facility are caucasian. She also reports that pt exhibits grandiose delusions that he is in the [employer], despite being 62 years of age and mildly to moderately mentally retarded.  Substance  Abuse: Pt denies any substance abuse problems, which Ms Amie Critchley corroborates for the 2 months that pt has lived at [facility]. [facility] records indicate that pt has had a history of unspecified substance abuse problems in the past. At this time pt does not appear to be intoxicated or in withdrawal.  Social Supports:  Pt identifies a brother and a sister as his main social supports. However her reports that neither of them will allow him to live in their households. His court appointed guardian, as noted above, is [employer] with [employer] [place],  (phone: [phone]; fax: [phone]). Ms Fears faxed documentation assigning guardianship to her agency to [facility].  Treatment History:  Pt has been admitted to [facility] on four occasions, in 04/2003, in 01/2000, in 04/1999, and in 12/1998. He also reports a history of many hospitalizations at "[facility] He reports that he has been an outpatient client at [facility], then at [facility], "all my life." However, he reports that his years of treatment have resulted in his mental health problems being "cured." Nonetheless, today he wants very much to be admitted to [facility]. Please note that Ms Amie Critchley reports that the pt has been very disruptive during his time as [facility]. The  other residents are fearful of him, and for this reason he will not be allowed to return to their facility.

## 2013-01-21 NOTE — Progress Notes (Signed)
Writer attempted to obtain a copy of the patients [place] Start Report but was unsuccessful.  The Office is closed for the holiday.  Writer left a message for the [facility] to fax the report and recommendation to [facility].    Writer is not able to complete the diversion form for [facility] without this documentation due to the patient MR diagnosis.

## 2013-01-21 NOTE — BH Assessment (Signed)
MHT spoke with Jilda Panda at [facility] and was asked to resend the referral. Referral was re-faxed for pt. MHT followed up with [facility] and was told IVC patients are not admitted. At 1143, Elease Hashimoto from [facility] called and asked me to resend the referral. Pt.'s referral was re-faxed to [facility]. Leane Call.  Dossie Arbour, MA  Disposition MHT

## 2013-01-21 NOTE — BH Assessment (Signed)
MHT spoke with Albin Felling at [facility]. She requested I fax labs, ER reports, and vital signs for the Nurse Practitioner to review. Paperwork was faxed to [facility].  Dossie Arbour, MA  Disposition MHT

## 2013-01-21 NOTE — BH Assessment (Signed)
No beds at [facility], per [place].  Dossie Arbour, MA  Disposition MHT

## 2013-01-21 NOTE — BH Assessment (Signed)
No beds at [facility], per Bonita [place].  -Dossie Arbour, MA  Disposition MHT

## 2013-01-21 NOTE — BH Assessment (Addendum)
Per Annice Pih at [facility], patient was declined because he did not meet criteria. Patient was also declined at [facility], per [place], due to acuity.  -Dossie Arbour, MA  Disposition MHT

## 2013-01-21 NOTE — ED Notes (Signed)
Pt reports to this nurse that he feels a lot better and doesn't want to hurt himself or anyone else. Pt states he wants to go back to where he lives.

## 2013-01-21 NOTE — ED Notes (Signed)
Pt refused [facility]. Rn notified

## 2013-01-21 NOTE — ED Notes (Signed)
Pt continues to be polite, calm and cooperative

## 2013-01-21 NOTE — Clinical Social Work Note (Signed)
CSW spoke with Ava at [facility] for update on pt. Waiting on report from [facility] which is required in order to pursue [facility]. Per Ava, will continue inpatient bed search.   Derenda Fennel, [place] [phone]

## 2013-01-21 NOTE — ED Notes (Signed)
Pt alert and cooperative.  No needs identified.  No distress noted.

## 2013-01-21 NOTE — ED Notes (Signed)
Pt complied with nurse request for pt to take medicine, pt seems to respond better to males per report and observation.

## 2013-01-21 NOTE — BH Assessment (Signed)
MHT spoke with Dr. Marilynn Rail at [facility]. She stated that she has not reviewed the referral yet and will have the unit secretary to call me back.  -Dossie Arbour, MA  Disposition MHT

## 2013-01-21 NOTE — BH Assessment (Signed)
Writer spoke to patient's nurse Tammy Sours regarding patient's mental health status today. Pt apparently told his nurse Tammy Sours today  that he "Feels a lot better and doesn't want to hurt himself or anyone else". "Pt states he wants to go back to where he lives".   Writer informed patient's nurse Tammy Sours that a telepsych would be initiated and completed in order to possibly discharge this patient from [facility]. Writer contacted Nanine Means, NP but she is unavailable. Writer contacted Julieanne Cotton but she is unavailable as she is working on patient's at [employer] and her shift is ending soon. Dr. Lucianne Muss contacted but she is also unavailable. Writer discussed with AC-Shalita and we both agreed to have patient re-evaluated by an available extender in the am.   Disposition: Pending evaluation by an extender in the am 01/22/13. Patient stating he is no longer suicidal, contracts for safety, and requesting to discharge home.

## 2013-01-21 NOTE — ED Notes (Signed)
Patient refused all of his 2200 meds due to the nurse being black. Attempted to get another nurse to take meds but he still refused. Stated he was going back to sleep

## 2013-01-21 NOTE — ED Notes (Signed)
Per Ava at [facility], pt is to see a provider tomorrow and then the plans are for the patient to be DC back to his facility.

## 2013-01-21 NOTE — ED Notes (Signed)
ptient still refuses to take meds. States he is going to sleep and to leave him alone

## 2013-01-22 ENCOUNTER — Encounter (HOSPITAL_COMMUNITY): Payer: Self-pay | Admitting: Psychiatry

## 2013-01-22 LAB — GLUCOSE, CAPILLARY
Glucose-Capillary: 177 mg/dL — ABNORMAL HIGH (ref 70–99)
Glucose-Capillary: 140 mg/dL — ABNORMAL HIGH (ref 70–99)
Glucose-Capillary: 212 mg/dL — ABNORMAL HIGH (ref 70–99)

## 2013-01-22 NOTE — Progress Notes (Addendum)
Clinical Child psychotherapist (CSW) received call from RN stating that patient is medically ready for D/C back to [facility]. CSW contacted [facility] and spoke with supervisor/med tech [employer] who reported that "an Production designer, theatre/television/film has to approve the patient's admission and there is no Production designer, theatre/television/film on call this weekend and she cannot get in touch with anybody." Carlene also reported that she has to get prior approval from an administrator before the patient can return.  Jetta Lout, LCSWA Weekend CSW [phone]  Per social work assessment patient got a 30 day eviction notice from [facility]. I left message for patient's care coordinator Raquel James [phone] letting him know this information. I left report for weekday social workers to follow up.   Jetta Lout, LCSWA Weekend CSW [phone]

## 2013-01-22 NOTE — Progress Notes (Signed)
  Communicated to charge nurse and nurse assigned to patient that patient to remain in ED until Monday for placement to a facility. Social Worker to follow up on Monday regarding admission.

## 2013-01-22 NOTE — ED Notes (Signed)
Pt resting. No distress noted. 

## 2013-01-22 NOTE — Consult Note (Signed)
Telepsych Consultation   Reason for Consult:  Suicidality Referring Physician:  ED MD [name] is an 62 y.o. male.  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 Parkinson disease   . Hypertension   . Hypercholesterolemia   . Schizoaffective disorder   . Mild mental retardation   . Diabetes mellitus   . OA (osteoarthritis)   . COPD (chronic obstructive pulmonary disease)   . GERD (gastroesophageal reflux disease)   . Migraines    AXIS IV:  other psychosocial or environmental problems, problems related to social environment and problems with primary support group AXIS V:  61-70 mild symptoms  Plan:  No evidence of imminent risk to self or others at present.    Subjective:   Gerald Cruz is a 62 y.o. male patient does not warrant admission.  HPI:  Patient with mild MR got upset at his group home yesterday and made threats to hurt himself and others.  Today, he stated he was back on his medications and better.  He denied suicidal/homicidal ideations and hallucinations.  According to the notes, he may return to his group home and he reports, "I just want to go home.  I love those people."  The next time he gets upset, he said he would go to his room, shut his door, and calm down.  Pleasant and cooperative on exam, engages easily, answers questions appropriately. HPI Elements:   Location:  generalized. Quality:  acute. Severity:  mile. Timing:  issue at group home. Duration:  brief. Context:  stressor.  Past Psychiatric History: Past Medical History  Diagnosis Date  . Parkinson disease   . Hypertension   . Hypercholesterolemia   . Schizoaffective disorder   . Mild mental retardation   . Diabetes mellitus   . OA (osteoarthritis)   . COPD (chronic obstructive pulmonary disease)   . GERD (gastroesophageal reflux disease)   . Migraines     reports that he has been smoking  Cigarettes.  He has a 108 pack-year smoking history. He does not have any smokeless tobacco history on file. He reports that he does not drink alcohol or use illicit drugs. Family History  Problem Relation Age of Onset  . Heart attack      Mother, deceased  . Cancer Brother     unsure what type   Family History Substance Abuse: No Family Supports: Yes, List: (Brother, sister) Living Arrangements: Other (Comment) [facility]) Can pt return to current living arrangement?: No Allergies:  No Known Allergies  ACT Assessment Complete:  Yes:    Educational Status    Risk to Self: Risk to self Suicidal Ideation: No (Denies but tried to ignite O2 tank to kill others.) Suicidal Intent: No Is patient at risk for suicide?: No Suicidal Plan?: No Access to Means: No What has been your use of drugs/alcohol within the last 12 months?: Chart shows Hx of unspecified substance abuse; none current Previous Attempts/Gestures: No (None reported) How many times?: 0 Other Self Harm Risks: Dangerous impulsive behavior, including attempt to ignite O2 tank Triggers for Past Attempts: Other (Comment) (Not applicable) Intentional Self Injurious Behavior: None Family Suicide History: Yes (Half-sister: failed attempts; Brother, Sister: MI NOS) Recent stressful life event(s): Other (Comment) (Conflict w/ group home staff) Persecutory voices/beliefs?: Yes (Believes he is being mistreated for racial reasons.) Depression: Yes Depression Symptoms: Insomnia;Tearfulness;Isolating;Fatigue;Guilt;Loss of interest in usual  pleasures;Feeling worthless/self pity;Feeling angry/irritable (Hopelessness) Substance abuse history and/or treatment for substance abuse?: Yes (Chart shows Hx of unspecified substance abuse; none current) Suicide prevention information given to non-admitted patients: Not applicable (Tele-assessment: unable to provide)  Risk to Others: Risk to Others Homicidal Ideation: Yes-Currently  Present (Denies saying, "I love people;"  petition is more credible) Thoughts of Harm to Others: Yes-Currently Present Comment - Thoughts of Harm to Others: Locked self in bathroom, wrapped toilet paper around O2 tank, & tried to ignite it with cigarette lighter. Current Homicidal Intent: Yes-Currently Present Current Homicidal Plan: Yes-Currently Present Describe Current Homicidal Plan: Tied to ignite O2 tank w/ toilet paper & cigarette lighter. Access to Homicidal Means: Yes Describe Access to Homicidal Means: O2 tank, cigarette lighter, combustibles Identified Victim: Staff & residents at group home History of harm to others?: No (None reported) Assessment of Violence: In past 6-12 months (Threatening @ group home, cooperative w/ assessment) Violent Behavior Description: Threatening @ group home, cooperative w/ assessment Does patient have access to weapons?: Yes (Comment) (O2 tank, cigarette lighter, combustibles; no firearms) Criminal Charges Pending?: No Does patient have a court date: No  Abuse: Abuse/Neglect Assessment (Assessment to be complete while patient is alone) Physical Abuse: Denies Verbal Abuse: Denies Sexual Abuse: Denies Exploitation of patient/patient's resources: Denies Self-Neglect: Denies  Prior Inpatient Therapy: Prior Inpatient Therapy Prior Inpatient Therapy: Yes Prior Therapy Dates: 04/2003; 01/2000; 04/1999; 12/1998: [facility] Prior Therapy Facilty/Provider(s): "Quite a few times" @ "[facility]"  Prior Outpatient Therapy: Prior Outpatient Therapy Prior Outpatient Therapy: Yes Prior Therapy Dates: "All my life" [facility], then [facility]  Additional Information: Additional Information 1:1 In Past 12 Months?: No CIRT Risk: Yes Elopement Risk: No Does patient have medical clearance?: Yes                  Objective: Blood pressure 121/91, pulse 95, temperature 98.5 F (36.9 C), temperature source Oral, resp. rate 24, SpO2 97.00%.There  is no weight on file to calculate BMI. Results for orders placed during the hospital encounter of 01/17/13 (from the past 72 hour(s))  GLUCOSE, CAPILLARY     Status: Abnormal   Collection Time    01/19/13 11:33 AM      Result Value Range   Glucose-Capillary 180 (*) 70 - 99 mg/dL  GLUCOSE, CAPILLARY     Status: Abnormal   Collection Time    01/19/13  4:43 PM      Result Value Range   Glucose-Capillary 223 (*) 70 - 99 mg/dL  GLUCOSE, CAPILLARY     Status: Abnormal   Collection Time    01/20/13  2:50 AM      Result Value Range   Glucose-Capillary 154 (*) 70 - 99 mg/dL  GLUCOSE, CAPILLARY     Status: Abnormal   Collection Time    01/20/13  7:37 AM      Result Value Range   Glucose-Capillary 171 (*) 70 - 99 mg/dL  GLUCOSE, CAPILLARY     Status: Abnormal   Collection Time    01/20/13 12:05 PM      Result Value Range   Glucose-Capillary 172 (*) 70 - 99 mg/dL  GLUCOSE, CAPILLARY     Status: Abnormal   Collection Time    01/20/13  6:09 PM      Result Value Range   Glucose-Capillary 233 (*) 70 - 99 mg/dL   Comment 1 Documented in Chart     Comment 2 Notify RN    GLUCOSE, CAPILLARY  Status: Abnormal   Collection Time    01/21/13  7:14 AM      Result Value Range   Glucose-Capillary 176 (*) 70 - 99 mg/dL   Comment 1 Documented in Chart     Comment 2 Notify RN    GLUCOSE, CAPILLARY     Status: Abnormal   Collection Time    01/21/13  1:11 PM      Result Value Range   Glucose-Capillary 231 (*) 70 - 99 mg/dL   Comment 1 Documented in Chart     Comment 2 Notify RN    GLUCOSE, CAPILLARY     Status: Abnormal   Collection Time    01/21/13  4:20 PM      Result Value Range   Glucose-Capillary 232 (*) 70 - 99 mg/dL  GLUCOSE, CAPILLARY     Status: Abnormal   Collection Time    01/22/13  7:31 AM      Result Value Range   Glucose-Capillary 177 (*) 70 - 99 mg/dL   Labs are reviewed and are pertinent for no medical issues.  Current Facility-Administered Medications  Medication  Dose Route Frequency Provider Last Rate Last Dose  . albuterol (PROVENTIL) (5 MG/ML) 0.5% nebulizer solution 2.5 mg  2.5 mg Nebulization Q4H Flint Melter, MD   2.5 mg at 01/22/13 8295   And  . ipratropium (ATROVENT) nebulizer solution 0.5 mg  0.5 mg Nebulization Q4H Flint Melter, MD   0.5 mg at 01/22/13 0718  . aspirin chewable tablet 81 mg  81 mg Oral Daily Benny Lennert, MD   81 mg at 01/21/13 0955  . budesonide-formoterol (SYMBICORT) 80-4.5 MCG/ACT inhaler 2 puff  2 puff Inhalation BID Benny Lennert, MD   2 puff at 01/21/13 2011  . cyanocobalamin ((VITAMIN B-12)) injection 1,000 mcg  1,000 mcg Intramuscular Q30 days Benny Lennert, MD      . docusate sodium (COLACE) capsule 100 mg  100 mg Oral q morning - 10a Benny Lennert, MD   100 mg at 01/21/13 0955  . famotidine (PEPCID) tablet 40 mg  40 mg Oral Daily Benny Lennert, MD   40 mg at 01/21/13 0954  . fenofibrate tablet 160 mg  160 mg Oral QHS Benny Lennert, MD   160 mg at 01/21/13 2049  . gabapentin (NEURONTIN) capsule 300 mg  300 mg Oral QHS Benny Lennert, MD   300 mg at 01/21/13 2049  . insulin aspart (novoLOG) injection 14 Units  14 Units Subcutaneous TID AC Benny Lennert, MD   14 Units at 01/22/13 [phone]  . insulin detemir (LEVEMIR) injection 65 Units  65 Units Subcutaneous QHS Benny Lennert, MD   65 Units at 01/21/13 2050  . lisinopril (PRINIVIL,ZESTRIL) tablet 5 mg  5 mg Oral q morning - 10a Benny Lennert, MD   5 mg at 01/21/13 0956  . loratadine (CLARITIN) tablet 10 mg  10 mg Oral Daily Benny Lennert, MD   10 mg at 01/21/13 0955  . metFORMIN (GLUCOPHAGE) tablet 1,000 mg  1,000 mg Oral BID Benny Lennert, MD   1,000 mg at 01/21/13 2050  . metoprolol succinate (TOPROL-XL) 24 hr tablet 25 mg  25 mg Oral q morning - 10a Benny Lennert, MD   25 mg at 01/21/13 0955  . polyethylene glycol (MIRALAX / GLYCOLAX) packet 17 g  17 g Oral Daily Flint Melter, MD   17 g at 01/21/13 0953  . predniSONE (  DELTASONE) tablet 10 mg   10 mg Oral Q breakfast Benny Lennert, MD   10 mg at 01/22/13 0824  . risperiDONE (RISPERDAL) tablet 1 mg  1 mg Oral BID Benny Lennert, MD   1 mg at 01/21/13 2050  . simvastatin (ZOCOR) tablet 5 mg  5 mg Oral q1800 Benny Lennert, MD   5 mg at 01/21/13 1802  . tiotropium (SPIRIVA) inhalation capsule 18 mcg  18 mcg Inhalation Daily Benny Lennert, MD   18 mcg at 01/21/13 0805  . traZODone (DESYREL) tablet 100 mg  100 mg Oral TID Benny Lennert, MD   100 mg at 01/21/13 2050   Current Outpatient Prescriptions  Medication Sig Dispense Refill  . aspirin 81 MG chewable tablet Chew 81 mg by mouth daily.      . budesonide-formoterol (SYMBICORT) 80-4.5 MCG/ACT inhaler Inhale 2 puffs into the lungs 2 (two) times daily.      . cetirizine (ZYRTEC) 10 MG tablet Take 10 mg by mouth every morning.      . cyanocobalamin (,VITAMIN B-12,) 1000 MCG/ML injection Inject 1,000 mcg into the muscle every 30 (thirty) days.      [name] Kitchen docusate sodium (COLACE) 100 MG capsule Take 100 mg by mouth every morning.      . fenofibrate 160 MG tablet Take 160 mg by mouth at bedtime.       . gabapentin (NEURONTIN) 300 MG capsule Take 300 mg by mouth at bedtime.      . insulin aspart (NOVOLOG) 100 UNIT/ML injection Inject 14 Units into the skin 3 (three) times daily before meals.      . insulin detemir (LEVEMIR) 100 UNIT/ML injection Inject 65 Units into the skin at bedtime.      [name] Kitchen lisinopril (PRINIVIL,ZESTRIL) 5 MG tablet Take 5 mg by mouth every morning.      . metFORMIN (GLUCOPHAGE) 1000 MG tablet Take 1,000 mg by mouth 2 (two) times daily.      . metoprolol succinate (TOPROL-XL) 25 MG 24 hr tablet Take 25 mg by mouth every morning.      . polyethylene glycol powder (GLYCOLAX/MIRALAX) powder Take 17 g by mouth every morning.      . pravastatin (PRAVACHOL) 40 MG tablet Take 40 mg by mouth at bedtime.      . predniSONE (DELTASONE) 10 MG tablet Take 10 mg by mouth daily with breakfast.      . ranitidine (ZANTAC) 300 MG tablet  Take 300 mg by mouth every morning.      . risperiDONE (RISPERDAL) 1 MG tablet Take 1 mg by mouth 2 (two) times daily.      [name] Kitchen tiotropium (SPIRIVA) 18 MCG inhalation capsule Place 18 mcg into inhaler and inhale daily.      . traZODone (DESYREL) 100 MG tablet Take 100 mg by mouth 3 (three) times daily.      [name] Kitchen ipratropium-albuterol (DUONEB) 0.5-2.5 (3) MG/3ML SOLN Take 3 mLs by nebulization every 6 (six) hours as needed (for shortness of breath and/or wheezing).        Psychiatric Specialty Exam:     Blood pressure 121/91, pulse 95, temperature 98.5 F (36.9 C), temperature source Oral, resp. rate 24, SpO2 97.00%.There is no weight on file to calculate BMI.  General Appearance: Casual  Eye Contact::  Good  Speech:  Normal Rate  Volume:  Normal  Mood:  Euthymic  Affect:  Congruent  Thought Process:  Coherent  Orientation:  Full (Time, Place, and Person)  Thought Content:  WDL  Suicidal Thoughts:  No  Homicidal Thoughts:  No  Memory:  Immediate;   Good Recent;   Good Remote;   Good  Judgement:  Fair  Insight:  Fair  Psychomotor Activity:  Normal  Concentration:  Good  Recall:  Good  Akathisia:  No  Handed:  Right  AIMS (if indicated):     Assets:  Leisure Time Resilience Social Support  Sleep:      Treatment Plan Summary: Medication Management--continue current regimant  Disposition: Return to group home and follow-up with his regular provider.  Nanine Means, PMH-NP 01/22/2013 8:51 AM

## 2013-01-22 NOTE — ED Notes (Signed)
Received report from Crystal,RN

## 2013-01-22 NOTE — Progress Notes (Addendum)
Got in touch with Lenice Pressman, owner of ALF pt came from, stated that she would not be willing to take pt back because of the concern of the safety for the pts and staff.  Will consult with [facility] EDP for SW consult.  Spoke with Aundra Millet, pt's RN, regarding conversation with pt's ALF owner and need for SW consult.  AC, Maryjo Rochester will try to get in contact with [facility] EDP to make him aware of current situation and need for consult.   Tomi Bamberger, MHT

## 2013-01-22 NOTE — Progress Notes (Signed)
Clinical social worker spoke with Gerald Cruz, [place] guardian for [facility]. Per [name], they will work on placement for patient in a new ALF on Monday. Per chart review, owner of ALF is refusing patient to return due to threatening to blow up the alf with a lighter in his hand when police arrived. Weekend CSW consulted with CSW Director who stated that patient would have to remain until Monday when more resources available to assist with placement of patient. Weekend CSW agreed to communicate this further with RN.   [name] KitchenCatha Gosselin, [place] [phone]  ED CSW .01/22/2013 1742pm

## 2013-01-22 NOTE — Progress Notes (Signed)
  Communicated with nurse regarding patient. Was informed patient medically cleared for discharge from ED.  Patient to return to [facility]. Currently, this is on hold due to staff at facility not able to admit patient back at [facility].  [place] on call social worker regarding issue. Received call back from [facility] SW regarding patient.  Informed that the SW supervisor Hope Rife to be contacted or Dr. Jacky Kindle.  SW was able to contact facility and also left message for Raquel James the coordinator for patient.  SW to follow up with supervisor regarding patient admission to facility. Received call back from SW regarding patient. Per SW supervisor, patient to remain in the ED until placement can be handled on Monday.

## 2013-01-22 NOTE — BH Assessment (Addendum)
As of 01/21/2013 patient denies suicidal thoughts, no plan, no intent, and no means. Patient evaluated by Nanine Means, NP this am and she recommends discharge back to [facility] [phone]. Writer spoke to the "[employer] whom sts that she is in charge at this time. Writer informed Carlina of patient's updated disposition to discharge back to their facility. Carlina sts, "I can't authorize that you will have to speak to the owner Lenice Pressman [phone]. Writer called Ms. Blackwell 2x's but she did not answer the phone. Her voicemail is apparently full and this Clinical research associate is unable to leave a message for her. Writer re-called the group home back and left a message with staff to have Ms. Blackwell call [facility] staff regarding patient's disposition.    Additionally, this Clinical research associate has contacted [facility] to follow up and/or assist in getting this patient discharged back to the group home. [facility] may not have a SW available on the weekends, per Geographical information systems officer.  [facility] secretary will contact the [facility] for further assistance in finding a SW to assist with this matter.

## 2013-01-22 NOTE — ED Notes (Signed)
Lenice Pressman contacted [employer], refusing to take patient back to facility. Behavioral health requesting social work consult for placement

## 2013-01-22 NOTE — ED Notes (Signed)
AC from [facility] reports pt was cleared by psychiatrist to return to facility.  Called [facility] and was told by Felipa Eth, Med tech supervisor, that pt could not return today.  States she could not authorize pt returning to facility over the weekend.  She said the pt would have to stay in the ED until Monday when her administrator returns.  Asked C. Renette Butters if she can call her administrator on call and was told she could not contact anyone on the weekend.  Notified [place] with Social Work and was told she would look into the situation.

## 2013-01-22 NOTE — ED Notes (Signed)
Called to get update from [facility] home, informed that Supervisor is not sure if she can accept the patient or not and is unable to get in touch with the administrator. Attempted to called Lenice Pressman, but number is disconnected

## 2013-01-22 NOTE — BH Assessment (Addendum)
[facility] Assessment Progress Note  Late note:  Called [facility] and pt's tele psych scheduled @ [phone].  Pt has already been seen by Nanine Means, NP, for tele assessment.

## 2013-01-22 NOTE — ED Notes (Signed)
After consulting case management, informed that patient will remain with [place] until at least Monday

## 2013-01-22 NOTE — Progress Notes (Signed)
Clinical Social Worker (CSW) spoke with [facility] of Social Work who reported that CSW will have to follow up on Monday because there is nothing more we can do over the weekend. Per notes in chart Weekday CSW is working with [facility] to assist with D/C. Weekday CSW will follow up.   Jetta Lout, LCSWA Weekend CSW [phone]

## 2013-01-22 NOTE — BH Assessment (Signed)
Gerald Cruz with [facility] [employer] [place], [place] (phone: [phone]; fax: [phone]) is patient's court appointed guardian. Writer has called Gerald Cruz and left a message regarding patient's disposition. Additionally, Clinical research associate has called their emergency # [phone] to reach [name]. Writer spoke to the emergency on call staff [name] whom staff sts that patient is not allowed to return back to facility and has been given an eviction notice the day that the incident occurred a few days ago. Writer questioned patient being given 30 days before evaicted him from the facility. Cassandra sts that patient can legally be evicted if he is a danger to other residents in the facility. Cassandra sts that they will not be able to start seeking alternative patient for this patient until Monday.   Writer has contacted the charge nurse at [employer] to update her with the above information. The charge nurse sts she will get in touch with SW immediately to assist with this matter.

## 2013-01-22 NOTE — Consult Note (Signed)
Case discussed, agree with plan 

## 2013-01-23 LAB — GLUCOSE, CAPILLARY: Glucose-Capillary: 275 mg/dL — ABNORMAL HIGH (ref 70–99)

## 2013-01-23 MED ORDER — LOPERAMIDE HCL 2 MG PO CAPS: 4.0000 mg | ORAL_CAPSULE | ORAL | Status: DC | PRN

## 2013-01-23 MED ORDER — LOPERAMIDE HCL 2 MG PO CAPS
4.0000 mg | ORAL_CAPSULE | Freq: Once | ORAL | Status: AC
  Administered 2013-01-23: 4 mg via ORAL
  Filled 2013-01-23: qty 2

## 2013-01-23 NOTE — Progress Notes (Signed)
Clinical Child psychotherapist (CSW) received call from patient's court appointed guardian through [employer] [phone]. Cassandra reported that patient received a 30 day eviction notice from [facility] on 01/18/13. I gave Karl Bales CSW's contact information. Cassandra reported that she would be in touch with CSW around 9:30 on Monday morning to discuss placement options for patient.   Jetta Lout, LCSWA Weekend CSW [phone]

## 2013-01-23 NOTE — ED Notes (Signed)
Pt ate 100% on dinner tray,

## 2013-01-23 NOTE — ED Notes (Signed)
Pt calmer at present, sitting in room talking with sitter,

## 2013-01-23 NOTE — ED Notes (Signed)
Pt updated on plan of care, calmer at present, sitter remains at bedside,

## 2013-01-23 NOTE — ED Notes (Addendum)
Security at bedside to assist with calming pt down,

## 2013-01-23 NOTE — ED Notes (Signed)
Resp paged

## 2013-01-23 NOTE — ED Notes (Signed)
Pt given lunch tray, states "I am not sure if I am going to eat" comfort measure provided,

## 2013-01-23 NOTE — ED Notes (Signed)
Pt continues to have diarrhea, Dr. Juleen China notified, additional orders given

## 2013-01-23 NOTE — ED Notes (Signed)
Pt out in hall, walking around nursing desk, fussing about being in er, pt assisted back to treatment room,

## 2013-01-23 NOTE — ED Notes (Signed)
Pt requesting his wallet, one black wallet removed from pocket of bib overalls and given to pt,

## 2013-01-23 NOTE — ED Notes (Signed)
Pt ate 100% of lunch tray,

## 2013-01-23 NOTE — ED Notes (Signed)
Pt upset with being in er, requesting to call his sister, pt was able to contact his sister (Racheal) who advises that she will contact pt's brother to see if pt can stay with him per pt's request, I spoke with Rachael per pt's request and permission, who stated that pt has a hard time understanding that he can not do what he wants. Sister advised that she will call pt's brother and let him know that he is in the er,

## 2013-01-23 NOTE — ED Notes (Signed)
Pt continues to come to nursing desk asking same questions about why he is staying in er, multiple attempts made to explain to pt why he is in er, pt becomes upset each time, assisted back to his room,

## 2013-01-23 NOTE — ED Notes (Signed)
Pt out to nursing desk without a shirt on, verbally loud, stating " I want to go home, they have treated me awful" Pt assisted back to room, new shirt given to pt, comfort measures provided,

## 2013-01-24 ENCOUNTER — Encounter (HOSPITAL_COMMUNITY): Payer: Self-pay | Admitting: Family

## 2013-01-24 DIAGNOSIS — F259 Schizoaffective disorder, unspecified: Secondary | ICD-10-CM

## 2013-01-24 LAB — GLUCOSE, CAPILLARY
Glucose-Capillary: 103 mg/dL — ABNORMAL HIGH (ref 70–99)
Glucose-Capillary: 159 mg/dL — ABNORMAL HIGH (ref 70–99)
Glucose-Capillary: 183 mg/dL — ABNORMAL HIGH (ref 70–99)

## 2013-01-24 MED ORDER — IPRATROPIUM-ALBUTEROL 0.5-2.5 (3) MG/3ML IN SOLN
3.0000 mL | RESPIRATORY_TRACT | Status: DC
  Administered 2013-01-24 – 2013-01-29 (×18): 3 mL via RESPIRATORY_TRACT
  Filled 2013-01-24 (×23): qty 3

## 2013-01-24 MED ORDER — ZIPRASIDONE MESYLATE 20 MG IM SOLR: 10.0000 mg | Freq: Once | INTRAMUSCULAR | Status: DC

## 2013-01-24 MED ORDER — RISPERIDONE 1 MG PO TBDP
1.0000 mg | ORAL_TABLET | Freq: Three times a day (TID) | ORAL | Status: DC
  Administered 2013-01-24 – 2013-01-29 (×15): 1 mg via ORAL
  Filled 2013-01-24 (×16): qty 1

## 2013-01-24 MED ORDER — ZIPRASIDONE HCL 20 MG PO CAPS
20.0000 mg | ORAL_CAPSULE | Freq: Once | ORAL | Status: AC
  Administered 2013-01-24: 20 mg via ORAL
  Filled 2013-01-24: qty 1

## 2013-01-24 MED ORDER — LORAZEPAM 2 MG/ML IJ SOLN
1.0000 mg | Freq: Four times a day (QID) | INTRAMUSCULAR | Status: DC | PRN
  Administered 2013-01-26: 2 mg via INTRAMUSCULAR
  Filled 2013-01-24: qty 1

## 2013-01-24 MED ORDER — ZIPRASIDONE MESYLATE 20 MG IM SOLR
INTRAMUSCULAR | Status: AC
  Filled 2013-01-24: qty 20

## 2013-01-24 NOTE — ED Notes (Signed)
Pt calm and cooperative again, police at bedside.

## 2013-01-24 NOTE — ED Notes (Signed)
Pt angry cursing, slamming fist on table, pacing.

## 2013-01-24 NOTE — ED Notes (Signed)
Pt is now calm and cooperative, polite.

## 2013-01-24 NOTE — Clinical Social Work Note (Signed)
FL2 faxed to [facility] in [place] [phone]) at guardian's request.  Considering for placement.  Spoke w Damian Leavell, Production designer, theatre/television/film at [facility].  Facility will not accept patient back at discharge, are issuing an immediate eviction notice due to threat to residents and staff.  Notice will be faxed to guardian by [facility].  Santa Genera, LCSW Clinical Social Worker [phone])

## 2013-01-24 NOTE — Clinical Social Work Note (Signed)
Per Vena Rua, DD intake coordinater at [facility] (hospital liaison) recommended patient be referred to [facility] on 12/23.  T[place] at [facility] informed and states referrals will be made today.  Says will be reevaluated by [facility] extender.  Spoke w Irish Lack, [place] [facility], evaluator Geraldo Pitter) did not recommend inpatient hospitalization for patient at that time, will consider reevaluation.  Spoke w Vena Rua, DD intake coordinator at [facility].  Patient does not have care coordinator assigned at this time - Henreitta Cea is listed in their system, but he does not provide care coordination services.  Says she will staff situation w supervisor, Trude Mcburney, to determine if [facility] can assist.  Santa Genera, LCSW Clinical Social Worker [phone])

## 2013-01-24 NOTE — ED Notes (Signed)
Out of cuffs, police at bedside. Pt calm, continues to blame others for behavior.

## 2013-01-24 NOTE — Clinical Social Work Note (Signed)
[facility] reviewed information, per Pollie Friar  they will not reevaluate, feel current issues are "behavioral" and not related to mental health issues.  E Lancaster ([facility]) requested copy of medications list, will be faxed when guardian returns consent.  Per guardian, Coralee Rud (PA from [facility]) saw patient on 01/04/13, patient was not sleeping, making threats, saying he was in special forces in [place], black helicopters were circling the ALF following him.  PA was going to add medications to address psychiatric symptoms, but added nonnarcotic pain medication first to address back pain issues.  Was to return in approx 2 weeks to address psychiatric symptoms.  Said patient was seen at [facility] in early December and no medications were prescribed by MD at that visit.  Santa Genera, LCSW Clinical Social Worker [phone])

## 2013-01-24 NOTE — ED Notes (Signed)
Meal tray provided.

## 2013-01-24 NOTE — BH Assessment (Signed)
Gerald Cruz was contacted earlier today for assistance with this patient's disposition. Apparently, staff has already evaluated this patient several days ago. However, this patient may need to be re-evaluated due to behaviors in the past 24 hours. Reportedly staff agreed to make a decision regarding a re-assessment and call back with a decision.  Writer later received a call from from [place] with [employer]. Di Kindle sts that patient will not be re-evaluated. Sts that patient does meet criteria for inpatient hospitalization and that patient is exhibiting behavioral issues not psychosis.   Laurel recommended calling the [facility] hospital liason- Alveta Heimlich [phone]. Sts that she should be able to assist with getting patient back to his current residence or another appropriate residence.   Writer discussed all of the above information with Thurston Hole, LCSW.

## 2013-01-24 NOTE — ED Notes (Signed)
Pt again escalating, cursing at staff, picks up chair and threatens to strike staff. Police and security called. Deescalation accomplished by two RNs. Pt angry, states "When I leave here I will kill you all and blow this place up!" "You son of a bitches are poisoning me"

## 2013-01-24 NOTE — ED Notes (Signed)
Spoke with Aggie Moats from [facility], she states that they are unwilling to take patient due to their safety concerns.
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
## 2013-01-24 NOTE — ED Notes (Signed)
Multiple staff using deescalating.

## 2013-01-24 NOTE — ED Notes (Signed)
Spoke with Lily Kocher from [facility], they are working on [facility] placement. Spoke with A/C about getting a Comptroller. Police have left.
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
## 2013-01-24 NOTE — BH Assessment (Addendum)
Patient evaluated by an extender Gerald Means, NP 01/22/2013 and discharge home was recommended. Patient denied SI, HI, and AVH's on that date.  Writer spoke LCSW-Gerald throughout the day regarding this patient's disposition. Due to patient's behaviors in the past 24 hours (see EPIC notes) patient may not be appropriate to discharge. Patient going to [facility] (diversion), [facility], and [facility] may be options. Writer later informed that the [facility] (diversion) may not be an option due to [facility] not supporting the need for inpatient hospitalization. Writer discussed patient's possible need for inpatient treatment with [facility] to the following facilities: [facility] and [facility].    Writer discussed the content of patient's case with Wandra Feinstein, NP as she evaluated him 01/22/2013. She suggested re-evaluating this patient. Patient was re-evaluated by Gerald Mayhew, NP. Patient re-evaluated by Gerald Mayhew, NP on this day and he recommended discharge back to the group home. Writer updated EDP-Dr. Bebe Shaggy and LCSW-Gerald of Gerald W.'s recommendation.

## 2013-01-24 NOTE — ED Provider Notes (Signed)
Pt seen to assist with placement Pt has had another telepsych assessment, and it is not recommended that he have inpatient psych care but may update his medication regime He has been medically cleared in the emergency department I have spoke to his guardian with [facility], and they will continue to work on placement in another facility, but at this point he should be able to return to [facility] as he received a 30 day eviction notice. I called [facility] [phone]) and their administrator Lenice Pressman is out of town until tomorrow and would not be able to make any decisions until that time.    Joya Gaskins, MD 01/24/13 1200

## 2013-01-24 NOTE — ED Notes (Signed)
Pt now calm and cooperative. Still negative content "My daughter's going to give me hell", but apologetic.

## 2013-01-24 NOTE — ED Provider Notes (Addendum)
Patient here with aggressive and homicidal actions.  Patient is mmr and lives in group home.  Patient is agitated and screaming at staff and attempted to leave department requiring recall by staff.  He is verbally and physically agitated and threatening.  Patient is medicated with geodon and will labs reviewed and vitals norma.   Psychiatry is being asked to reassess patient now.   Hilario Quarry, MD 01/24/13 1610  Hilario Quarry, MD 01/24/13 [phone]

## 2013-01-24 NOTE — ED Notes (Signed)
MD Wickline reports social worker is working on assessment for group home placement.

## 2013-01-24 NOTE — ED Notes (Signed)
Police at bedside still, pt quieter.
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
## 2013-01-24 NOTE — ED Notes (Signed)
Pt angry, yelling "people be making shit up! You're a son of a bitch you mother fucker!" "I want to go and I'm going". Security called x2, Mudlogger.

## 2013-01-24 NOTE — ED Notes (Signed)
Pt eloped out ambulance door despite efforts by multiple staff to deescalate patient. MD present. Patient is now in police custody brought back to room. Angry, cursing.

## 2013-01-25 LAB — GLUCOSE, CAPILLARY
Glucose-Capillary: 203 mg/dL — ABNORMAL HIGH (ref 70–99)
Glucose-Capillary: 146 mg/dL — ABNORMAL HIGH (ref 70–99)

## 2013-01-25 MED ORDER — ACETAMINOPHEN 325 MG PO TABS
650.0000 mg | ORAL_TABLET | Freq: Three times a day (TID) | ORAL | Status: DC | PRN
  Administered 2013-01-25: 650 mg via ORAL
  Filled 2013-01-25: qty 2

## 2013-01-25 MED ORDER — RISPERIDONE 1 MG PO TABS
ORAL_TABLET | ORAL | Status: AC
  Filled 2013-01-25: qty 1

## 2013-01-25 MED ORDER — GABAPENTIN 300 MG PO CAPS
ORAL_CAPSULE | ORAL | Status: AC
  Filled 2013-01-25: qty 1

## 2013-01-25 MED ORDER — FENOFIBRATE 160 MG PO TABS
ORAL_TABLET | ORAL | Status: AC
  Filled 2013-01-25: qty 1

## 2013-01-25 NOTE — ED Notes (Signed)
Pt is agitated and stating that we are lying to him and holding him against his will. Pt is upset and states he will be here forever.

## 2013-01-25 NOTE — Clinical Social Work Note (Signed)
Guardian requested patient be faxed out to [facility] and [facility] in [place].  Clinicals sent.  Santa Genera, LCSW Clinical Social Worker [phone])

## 2013-01-25 NOTE — ED Notes (Signed)
Pt upset this morning because he is continually told that he should be leaving soon and then the plan does not develop. This nurse explained to the pt that placement is not as easy as he may think and his behavior dictates the speed and ease of placement. The pt appears very distressed at having to stay in the ER and does not grasp the complexity of the situation. The pt has been in this ER for more than 180 hours most of which have been spent in a small room with a sitter outside of the room. With the current situation, some of the pt's behaviors are outlets of the frustration and feelings of confinement. The pt repeats that he feels like a prisoner.

## 2013-01-25 NOTE — ED Notes (Signed)
Patient complaining of bilateral lower leg pain. 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 Advised MD.

## 2013-01-25 NOTE — Clinical Social Work Note (Signed)
[facility] in [place] has declined patient.  [facility] will not accept patient back.  CSW submitted PASARR screen for review.  May need Level 2 screen.  Discussed w supervisor, suggested that CSW contact [facility] in [place], spoke w director who will review clinicals and decide if they can offer bed pending receipt of Level 2 Pasarr approval.  CSW also faxed patient to [facility] in [place].  Updated guardian.  Santa Genera, LCSW Clinical Social Worker [phone])

## 2013-01-25 NOTE — ED Notes (Signed)
Pt insisted on speaking to this nurse about his fear of his guardian. Pt states "my guardian is trying to kill me.  A while back I went to eat with all three of them and they took me to a place where we ate outside and I had a hamburger but wasn't hungry. All three of them got up to go to the bathroom at the same time and I saw a hungry dog beside me and since I wasn't hungry so I gave the hamburger to the dog.  It killed that dog and I think they were trying to kill me". The pt was insistent that this be put in his chart.

## 2013-01-25 NOTE — ED Notes (Signed)
Pt up to shower, clean clothes provided

## 2013-01-26 LAB — GLUCOSE, CAPILLARY
Glucose-Capillary: 120 mg/dL — ABNORMAL HIGH (ref 70–99)
Glucose-Capillary: 155 mg/dL — ABNORMAL HIGH (ref 70–99)
Glucose-Capillary: 92 mg/dL (ref 70–99)
Glucose-Capillary: 134 mg/dL — ABNORMAL HIGH (ref 70–99)
Glucose-Capillary: 184 mg/dL — ABNORMAL HIGH (ref 70–99)

## 2013-01-26 MED ORDER — IBUPROFEN 800 MG PO TABS
800.0000 mg | ORAL_TABLET | Freq: Three times a day (TID) | ORAL | Status: DC | PRN
  Administered 2013-01-26: 800 mg via ORAL
  Filled 2013-01-26: qty 1

## 2013-01-26 NOTE — ED Notes (Signed)
Pt is currently pleasant, calm, and cooperative. Social work requesting actual note by EDP stating medical clearance in the chart. EDP aware and will do so. Social work also stating that BH still needs to have MD co-sign note stating pt is no longer considered a behavioral health patient. BH contacted and stated they would do so today.

## 2013-01-26 NOTE — Consult Note (Signed)
  I interviewed Gerald Cruz after reviewing the notes since his admission to the ER at [facility].  The recent notes indicate he is responding to his medication and the almost 10 days there and has no desire to hurt himself or anyone else.  He says clearly that he feels good, does not want to hurt himself or anyone else.  He wants to be discharged and says he knows he cannot go back to the home he was in but does not know why.  I concur with the recommendation that he no longer meets criteria for admission and should be discharged.

## 2013-01-26 NOTE — Clinical Social Work Note (Signed)
Patient awaiting face to face evaluation by PASARR screener, will need results of this screen before can be placed in ALF.  CSW has faxed patient out for beds to following assisted living facilities:  [facility] (no openings), [facility] (no openings), [facility] (cannot make decision until 01/31/13 when they can come to assess face to face), [facility] (has documents and are reviewing), [facility] Clinical research associate not in, will continue to recontact), [facility] (per administrator patient cannot return to this facility), [facility] (left message for administrator to contact CSW).  Left message for [facility] to ask for update on their ability to assist patient.  Spoke w E Fears, guardian, provided update.  Guardian concerned that placement will be extremely difficult given behaviors patient is exhibiting in ED.  Guardian requesting to speak w patient advocate in ED, also requesting to speak w behavioral health assessment team.  CSW gave guardian contact information.  Santa Genera, LCSW Clinical Social Worker [phone])

## 2013-01-26 NOTE — ED Notes (Signed)
Gerald Cruz from [facility] called and requesting updated labs and blood sugar readings. Gerald Cruz updated and reported pt pending at [facility] at this time.

## 2013-01-26 NOTE — ED Notes (Signed)
Patient complaining or returning bilateral leg pain.

## 2013-01-26 NOTE — ED Notes (Signed)
Patient upset and fussing over not having a place to go. States that he is mad as h--- because the social worker lied to him and said he was going to leave today. Informed patient that he does not have any place to go at this time. That they have contacted several facilities and they have not found anyone that has a bed that he can go to at this time.

## 2013-01-26 NOTE — Clinical Social Work Note (Signed)
PASARR screener completed evaluation, will review documents and make recommendation.  PASARR number, if issued, will be available no sooner than 01/28/13.  PASARR number required for placement in ALF.    Santa Genera, LCSW Clinical Social Worker [phone])

## 2013-01-26 NOTE — Clinical Social Work Note (Deleted)
CSW spoke w patient, discussed possibility of SNF placement.  Patient clearly states that she does not want SNF placement, says that she will have CAP aide during day and thinks she will have RN at night.  CSW clarified that [facility] RN will not be staying all night and will only come in periodically during the day to provide nursing services.  Family would need to be at the home caring for patient when CAP aide is not working.  Patient seemed to understand this, but RN CM is clarifying HH support available to patient w family.  Santa Genera, LCSW Clinical Social Worker [phone])

## 2013-01-26 NOTE — Clinical Social Work Note (Signed)
Gerald Cruz, [facility] hospital liaison, called w update.  Says [facility] may be considering respite placement at [facility] but was unsure how this would be funded.  Will call CSW Reubin Milan w further information.  Santa Genera, LCSW Clinical Social Worker [phone])

## 2013-01-26 NOTE — BH Assessment (Signed)
LSCW and patient's nurse have requested that the examining PA from 01/24/2013 Constance Haw have a physician sign off on his note. Writer notified Dr. Dub Mikes that this patient's note needed to be co-signed.

## 2013-01-26 NOTE — Clinical Social Work Note (Addendum)
CSW received call from Kizzie Fantasia with [facility] who reports that pt was assigned an IDD care coordinator- Zadie Cleverly [phone]). Windell Moulding said that placement will definitely not happen today, but can follow up with [facility] on Friday for update. Steele Sizer will be working with [facility] on possible respite. Updated Alcario Drought, pt's guardian who states she just spoke to [place] and is aware.   Derenda Fennel, [place] [phone]

## 2013-01-27 LAB — GLUCOSE, CAPILLARY
GLUCOSE-CAPILLARY: 89 mg/dL (ref 70–99)
Glucose-Capillary: 129 mg/dL — ABNORMAL HIGH (ref 70–99)
Glucose-Capillary: 173 mg/dL — ABNORMAL HIGH (ref 70–99)
Glucose-Capillary: 153 mg/dL — ABNORMAL HIGH (ref 70–99)

## 2013-01-27 MED ORDER — LORAZEPAM 1 MG PO TABS
ORAL_TABLET | ORAL | Status: AC
  Administered 2013-01-27: 1 mg via ORAL
  Filled 2013-01-27: qty 1

## 2013-01-27 MED ORDER — POLYETHYLENE GLYCOL 3350 17 G PO PACK
PACK | ORAL | Status: AC
  Filled 2013-01-27: qty 1

## 2013-01-27 MED ORDER — ASPIRIN 81 MG PO CHEW
CHEWABLE_TABLET | ORAL | Status: AC
  Administered 2013-01-27: 10:00:00 81 mg via ORAL
  Filled 2013-01-27: qty 1

## 2013-01-27 MED ORDER — LORATADINE 10 MG PO TABS
ORAL_TABLET | ORAL | Status: AC
  Administered 2013-01-27: 10:00:00 10 mg via ORAL
  Filled 2013-01-27: qty 1

## 2013-01-27 MED ORDER — FAMOTIDINE 20 MG PO TABS
ORAL_TABLET | ORAL | Status: AC
  Administered 2013-01-27: 10:00:00 40 mg via ORAL
  Filled 2013-01-27: qty 2

## 2013-01-27 NOTE — ED Notes (Signed)
Patient resting with eyes closed at present.

## 2013-01-27 NOTE — ED Notes (Signed)
Talking with staff. Compliant

## 2013-01-27 NOTE — ED Notes (Signed)
Awake, wants to know when and where he's being dispo'd too. Explained that we don't know yet.

## 2013-01-27 NOTE — ED Notes (Signed)
Patient states he is not "taking anymore medicine. I'm going to stop eating and just die. Y'all treat me like shit. I'm going to leave here and kill myself". Patient allowed RN to give insulin shot, but refused prednisone pill.

## 2013-01-27 NOTE — ED Provider Notes (Signed)
Pt stable, ambulatory, mild cough noted He is still waiting on placement in facility BP 133/74  Pulse 88  Temp(Src) 98.1 F (36.7 C) (Oral)  Resp 20  Wt 205 lb (92.987 kg)  SpO2 95%   Joya Gaskinsonald W Amarra Sawyer, MD 01/27/13 [phone]

## 2013-01-28 LAB — GLUCOSE, CAPILLARY
GLUCOSE-CAPILLARY: 255 mg/dL — AB (ref 70–99)
Glucose-Capillary: 116 mg/dL — ABNORMAL HIGH (ref 70–99)
Glucose-Capillary: 132 mg/dL — ABNORMAL HIGH (ref 70–99)
Glucose-Capillary: 287 mg/dL — ABNORMAL HIGH (ref 70–99)

## 2013-01-28 MED ORDER — LORATADINE 10 MG PO TABS
ORAL_TABLET | ORAL | Status: AC
  Administered 2013-01-28: 11:00:00 10 mg via ORAL
  Filled 2013-01-28: qty 1

## 2013-01-28 MED ORDER — PREDNISONE 20 MG PO TABS
ORAL_TABLET | ORAL | Status: AC
  Administered 2013-01-28: 11:00:00 10 mg via ORAL
  Filled 2013-01-28: qty 1

## 2013-01-28 MED ORDER — LISINOPRIL 5 MG PO TABS
ORAL_TABLET | ORAL | Status: AC
Start: 2013-01-28 — End: 2013-01-28
  Administered 2013-01-28: 11:00:00 5 mg via ORAL
  Filled 2013-01-28: qty 1

## 2013-01-28 MED ORDER — TUBERCULIN PPD 5 UNIT/0.1ML ID SOLN
5.0000 [IU] | Freq: Once | INTRADERMAL | Status: DC
  Administered 2013-01-28: 5 [IU] via INTRADERMAL
  Filled 2013-01-28: qty 0.1

## 2013-01-28 MED ORDER — METFORMIN HCL 500 MG PO TABS
ORAL_TABLET | ORAL | Status: AC
  Administered 2013-01-28: 11:00:00 1000 mg via ORAL
  Filled 2013-01-28: qty 2

## 2013-01-28 MED ORDER — TRAZODONE HCL 50 MG PO TABS
ORAL_TABLET | ORAL | Status: AC
  Administered 2013-01-28: 11:00:00 100 mg via ORAL
  Filled 2013-01-28: qty 2

## 2013-01-28 MED ORDER — FAMOTIDINE 20 MG PO TABS
ORAL_TABLET | ORAL | Status: AC
  Administered 2013-01-28: 11:00:00 40 mg via ORAL
  Filled 2013-01-28: qty 2

## 2013-01-28 MED ORDER — ASPIRIN 81 MG PO CHEW
CHEWABLE_TABLET | ORAL | Status: AC
  Administered 2013-01-28: 11:00:00 81 mg via ORAL
  Filled 2013-01-28: qty 1

## 2013-01-28 MED ORDER — POLYETHYLENE GLYCOL 3350 17 G PO PACK
PACK | ORAL | Status: AC
  Filled 2013-01-28: qty 1

## 2013-01-28 NOTE — Clinical Social Work Note (Signed)
Update from Gerald Cruz, guardian.  Care coordinator is meeting w guardian today to determine plan of care.  Gerald Cruz is that [facility], may be tomorrow before they can accept him.  Requires that Tb test be done - asked RN to get Tb skin test ordered today.  [name], LCSW Clinical Social Worker [phone])

## 2013-01-28 NOTE — ED Notes (Signed)
Pt's zocor not in med drawer, ac notified.

## 2013-01-28 NOTE — ED Notes (Signed)
Pt tolerated meal tray well, talking out loud about various things, is planning on reporting the staff for being mean to him, will then be laughing and smiling.  Walks unaided to the bathroom.

## 2013-01-28 NOTE — Clinical Social Work Note (Signed)
Phone call from care coordinator, Sherren Mochaolly Tirrell.  [facility] has been accepted by [facility] for [facility] stay.  FL2 will need to be updated tomorrow by SW, with current medications added.  FL2 must be signed by MD on day of discharge.  RN informed that the following will be required at discharge:  [facility], dated on day of discharge, with medications correct on day of discharge added to form, Tb test, scripts for all medications, updated medication list.  No PASARR is required for this level of placement.  Guardian, Erica Fears, is to pick up patient approx 9 AM and transport to group home.    Santa GeneraAnne Gwynn Crossley, LCSW Clinical Social Worker [phone])

## 2013-01-28 NOTE — ED Notes (Signed)
rn spoke to [employer] social worker and she stated that she has spoken to the pt's guardian and a facility called "[facility]" was evaluating the pt. And that he needed a tb skin test. Dr. Adriana Simasook notified.

## 2013-01-28 NOTE — ED Notes (Signed)
Pt awake and sitting on side of the bed. Talking w. Sitter and staff. "I hope i'm leaving today"  Denies any si/hi.  Allowed staff to check his bs.  Am meal tray to bedside.

## 2013-01-28 NOTE — Clinical Social Work Note (Signed)
CSW attempted to contact Zadie Cleverlyolly Terrill (care coordinator) and Kizzie Fantasiauth Owens (hospital liaison) re discharge planning update for patient.  Left VM for both individuals.  Santa GeneraAnne Kandise Riehle, LCSW Clinical Social Worker [phone])

## 2013-01-28 NOTE — ED Notes (Signed)
Pt out to desk walking around at times, pt. Questioning when he was leaving. Delay explained. Pt nodded in understanding.

## 2013-01-28 NOTE — ED Notes (Signed)
Spoke with The PepsiDolly Cruz. Ms Gerald Cruz stated that there are no beds at [facility] or at any other Respite facility at this time. Although, she is also talking with [facility] about [facility] there and states that it looks hopeful at this time. If they do agree, discharge to that facility would occur sometime today. She will call back for an update as soon as she hears back from [facility].

## 2013-01-28 NOTE — ED Notes (Signed)
Spoke with Dewayne HatchAnn, Child psychotherapistsocial worker. She states that the pt should have an updated med list and Rx for all meds pt is currently on at time of discharge tomorrow. She states she is willing to do FL2 but it will need to be signed by EDP tomorrow before discharge ensuring everything remains current regarding medications. States [facility] has agreed to accept pt for 10 day respite care. TB skin test is also to be given, which has already been ordered.

## 2013-01-28 NOTE — ED Notes (Signed)
Phoned Rosana Hoesolly Terill at [phone], IDD care coordinator, left message requesting follow up information on Mr. Scherman re placement.

## 2013-01-28 NOTE — ED Notes (Signed)
Pt informed of breathing tx to be coming. Pt requesting a sandwich at this time. Pt informed of his blood sugars being up & that we would check it again & he could have 1 snack later tonight. Pt agreeable. Respiratory notified of the need for breathing tx.

## 2013-01-28 NOTE — ED Notes (Signed)
Pt completed am care, showered self, linens changed. Pt. Alert and calm at present.

## 2013-01-29 LAB — GLUCOSE, CAPILLARY: Glucose-Capillary: 106 mg/dL — ABNORMAL HIGH (ref 70–99)

## 2013-01-29 MED ORDER — FENOFIBRATE 160 MG PO TABS: 160.0000 mg | ORAL_TABLET | Freq: Every day | ORAL | Status: DC

## 2013-01-29 MED ORDER — METOPROLOL TARTRATE 25 MG PO TABS
ORAL_TABLET | ORAL | Status: AC
  Filled 2013-01-29: qty 1

## 2013-01-29 MED ORDER — INSULIN ASPART 100 UNIT/ML ~~LOC~~ SOLN: 14.0000 [IU] | Freq: Three times a day (TID) | SUBCUTANEOUS | Status: DC

## 2013-01-29 MED ORDER — LORAZEPAM 1 MG PO TABS: 1.0000 mg | ORAL_TABLET | Freq: Four times a day (QID) | ORAL | Status: DC | PRN

## 2013-01-29 MED ORDER — LISINOPRIL 5 MG PO TABS: 5.0000 mg | ORAL_TABLET | Freq: Every morning | ORAL | Status: DC

## 2013-01-29 MED ORDER — METOPROLOL SUCCINATE ER 25 MG PO TB24: 25.0000 mg | ORAL_TABLET | Freq: Every morning | ORAL | Status: DC

## 2013-01-29 MED ORDER — METFORMIN HCL 1000 MG PO TABS: 1000.0000 mg | ORAL_TABLET | Freq: Two times a day (BID) | ORAL | Status: DC

## 2013-01-29 MED ORDER — FAMOTIDINE 20 MG PO TABS
ORAL_TABLET | ORAL | Status: AC
  Filled 2013-01-29: qty 2

## 2013-01-29 MED ORDER — LORATADINE 10 MG PO TABS
ORAL_TABLET | ORAL | Status: AC
  Filled 2013-01-29: qty 1

## 2013-01-29 MED ORDER — GABAPENTIN 300 MG PO CAPS: 300.0000 mg | ORAL_CAPSULE | Freq: Every day | ORAL | Status: DC

## 2013-01-29 MED ORDER — ASPIRIN 81 MG PO CHEW: 81.0000 mg | CHEWABLE_TABLET | Freq: Every day | ORAL | Status: DC

## 2013-01-29 MED ORDER — RISPERIDONE 1 MG PO TBDP: 1.0000 mg | ORAL_TABLET | Freq: Three times a day (TID) | ORAL | Status: DC

## 2013-01-29 MED ORDER — IBUPROFEN 200 MG PO TABS: 400.0000 mg | ORAL_TABLET | Freq: Three times a day (TID) | ORAL | Status: DC | PRN

## 2013-01-29 MED ORDER — ASPIRIN 81 MG PO CHEW
CHEWABLE_TABLET | ORAL | Status: AC
  Filled 2013-01-29: qty 1

## 2013-01-29 MED ORDER — FAMOTIDINE 40 MG PO TABS: 40.0000 mg | ORAL_TABLET | Freq: Two times a day (BID) | ORAL | Status: DC

## 2013-01-29 MED ORDER — BUDESONIDE-FORMOTEROL FUMARATE 80-4.5 MCG/ACT IN AERO: 2.0000 | INHALATION_SPRAY | Freq: Two times a day (BID) | RESPIRATORY_TRACT | Status: DC

## 2013-01-29 MED ORDER — IPRATROPIUM-ALBUTEROL 0.5-2.5 (3) MG/3ML IN SOLN: 3.0000 mL | RESPIRATORY_TRACT | Status: DC

## 2013-01-29 MED ORDER — LORATADINE 10 MG PO TABS: 10.0000 mg | ORAL_TABLET | Freq: Every day | ORAL | Status: DC | PRN

## 2013-01-29 MED ORDER — SIMVASTATIN 5 MG PO TABS: 5.0000 mg | ORAL_TABLET | Freq: Every day | ORAL | Status: DC

## 2013-01-29 MED ORDER — TIOTROPIUM BROMIDE MONOHYDRATE 18 MCG IN CAPS: 18.0000 ug | ORAL_CAPSULE | Freq: Every day | RESPIRATORY_TRACT | Status: DC

## 2013-01-29 MED ORDER — DSS 100 MG PO CAPS: 100.0000 mg | ORAL_CAPSULE | Freq: Two times a day (BID) | ORAL | Status: DC | PRN

## 2013-01-29 MED ORDER — ACETAMINOPHEN 325 MG PO TABS: 650.0000 mg | ORAL_TABLET | Freq: Three times a day (TID) | ORAL | Status: DC | PRN

## 2013-01-29 MED ORDER — INSULIN DETEMIR 100 UNIT/ML ~~LOC~~ SOLN: 65.0000 [IU] | Freq: Every day | SUBCUTANEOUS | Status: DC

## 2013-01-29 MED ORDER — CYANOCOBALAMIN 1000 MCG/ML IJ SOLN: 1000.0000 ug | INTRAMUSCULAR | Status: DC

## 2013-01-29 NOTE — ED Notes (Signed)
1000 meds given except for colace and miralax, which were not in pyxis at time of discharge. Meds not scanned because it took for each med to download as administered.

## 2013-01-29 NOTE — ED Notes (Signed)
Pt ambulated to restroom & returned to room w/ no complications. Sitter remains in sit of pt. NAD noted.

## 2013-01-29 NOTE — Discharge Instructions (Signed)
Schizoaffective Disorder  Schizoaffective disorder is a condition with a combination of features of schizophrenia and a mood disorder. There are symptoms of a psychotic disorder such as distorted thinking, hallucinations or delusions (schizophrenia feature) and depression or mania (mood disorder feature). There are 2 subtypes based on the mood component:    Bipolar Type.   Depressive Type.  CAUSES   Schizoaffective disorder, like schizophrenia, appears to have distinct genetic links. It is unknown as to what exactly causes the disorder. Some experts believe it involves brain chemistry such as an imbalance of serotonin and dopamine in the brain. These naturally occurring chemicals help relay electronic signals in the brain and help regulate mood.  Other experts have speculated whether fetal exposure to toxins or viral illness, or even birth complications may play a role. However, there is no proof of this relationship.  SYMPTOMS   Symptoms are those of both schizophrenia and mood disorders. These may include:   Hearing, seeing, or feeling things that are not there (hallucinations).   False beliefs (delusions).   Not taking care of oneself (for example, not bathing or grooming).   Speaking in a way that makes no sense to others.   Withdrawing or feeling isolated from other people.   Thoughts that race from one idea to the next.   Feelings of sadness, guilt, hopelessness, and anxiety.   Feelings of being excessively happy, powerful, or energetic.   Feeling drained of energy.   Losing or gaining weight.   Being unable to concentrate.   Sleeping more or less than normal.  DIAGNOSIS   The diagnosis is made when the patient has features of both illnesses. The patient does not strictly have schizophrenia or a mood disorder alone. Unfortunately, determining if a patient has 2 separate illnesses (schizophrenia or a mood disorder), a combination of illnesses (schizophrenia and a mood disorder), or perhaps even a  separate illness apart from schizophrenia or a mood disorder is difficult.  An accurate diagnosis has 3 key parts:   The patient clearly has a major depressive disorder or mania.   They meet the criteria for schizophrenia (distorted thinking, hallucinations or delusions).   The patient must have psychosis for at least 2 weeks without a mood disorder.  The diagnosis of this disorder frequently requires several evaluations. Caregivers will evaluate the patient's symptoms over a length of time. The diagnosis can be made when the following are observed:   An uninterrupted period of mental illness (possibly lasting weeks or months) occurs during which a major depressive episode, a manic episode (both are mood disorder components), or a mixed episode occurs with symptoms of schizophrenia. A major depressive episode must include a depressed mood. Also:   This uninterrupted period of illness includes a minimum 2 week episode of active distorted thinking, hallucinations or delusions (schizophrenia feature).   During this same 2 week (or greater) period, the patient has no mood changes of depression or mania.   Outside of the specific 2 week period noted above, mood episodes are present for a big part of the total active and lingering periods of illness.   The disturbance is not due to the direct effects of a substance (such as street drugs or prescription medications) or a general medical condition.   The bipolar type is diagnosed if the disturbance includes a period of mania, a major depressive episode or a mixed episode.   The depressive type is diagnosed if the problem includes only major depressive episodes.  TREATMENT     prescribed to help with psychotic symptoms, stabilize  mood and treat depression. Psychotherapy can help curb distorted thoughts, teach appropriate social skills and decrease social isolation. Medications may include:  Antipsychotics. These are also called neuroleptics. These are prescribed to help with psychotic symptoms such as delusions, paranoia and hallucinations. Common medications in this category include clozapine (Clozaril), risperidone (Risperdal) and olanzapine (Zyprexa).  Mood-stabilizing medications. When the schizoaffective disorder is bipolar-type, mood stabilizers can level out the highs and lows of bipolar disorder, also known as manic depression. People with bipolar disorder have episodes of mania and depressed mood. Examples of mood stabilizers include lithium (Eskalith, Lithobid) and divalproex (Depakote).  Antidepressants. When depression is the underlying mood disorder, antidepressants can help with feelings of sadness, hopelessness, or difficulty with sleep and concentration. Common medications include citalopram (Celexa), fluoxetine and escitalopram (Lexapro). Non-medication treatment may include:  Psychotherapy and counseling. Building a trusting relationship in therapy can help people with schizoaffective disorder better understand their condition and feel hopeful about their future. Effective sessions focus on real-life plans, problems, and relationships. Skills and behaviors specific to the home or workplace may be discussed.  Family or group therapy. Treatment can be more effective when people with schizoaffective disorder can discuss their problems with others. Supportive group settings can help decrease social isolation and provide a reality check during periods of psychosis. In general, people with schizoaffective disorder have a better prognosis than people with schizophrenia, but not as good as people with mood disorders only. Long-term treatment is necessary The prognosis varies from person to person. HOME CARE  INSTRUCTIONS   Take your medicine as prescribed, even when you are feeling and thinking well.  Participate in individual and group counseling as recommended by your caregiver. SEEK MEDICAL CARE IF:   You feel that you are experiencing side effects from prescription medications.  Contact your caregiver if you feel that symptoms are worsening despite using medication as prescribed and participating in counseling/group therapy sessions. SEEK IMMEDIATE MEDICAL CARE IF:  You feel an urge to hurt yourself or are thinking about committing suicide. This is an emergency and you should be evaluated immediately. MAKE SURE YOU:   Understand these instructions.  Will watch your condition.  Will get help right away if you are not doing well or get worse. Document Released: 05/26/2006 Document Revised: 04/07/2011 Document Reviewed: 08/27/2012 [facility] Patient Information 2014 [place] [employer], [place].

## 2013-01-29 NOTE — ED Notes (Signed)
Pt carried to showers by security.

## 2013-01-29 NOTE — ED Provider Notes (Signed)
Gerald Cruz is a 63 y.o. male here for an extended time secondary to homicidal ideation, mental retardation, and agitated behavior. He has been stable the last few days. Plans have been made to get him to a group home. The patient wondered why he is leaving today. I explained to him that it was time to leave the emergency department. He understands that he cannot stay here any longer.   Gerald MelterElliott L Quiara Killian, MD 01/29/13 [phone]

## 2013-01-29 NOTE — Progress Notes (Signed)
Clinical Child psychotherapistocial Worker (CSW) received call from [employer], Mardene CelesteJoanna stating that patient's FL2 needs to be updated. CSW updated the patient's diet and medication list on the FL2. CSW faxed FL2 to RN who confirmed that she received the FL2. Please reconsult if further social work needs arise. CSW signing off.   Jetta LoutBailey Morgan, LCSWA Weekend CSW [phone]

## 2015-01-21 IMAGING — CR DG CHEST 2V
2 series · 2 of 2 positions shown · non-contrast
Comparison: 09/16/2010

CLINICAL DATA: Shortness of breath, congestion, COPD, smoker

EXAM:
CHEST  2 VIEW

[view not recorded (1 of 2)]
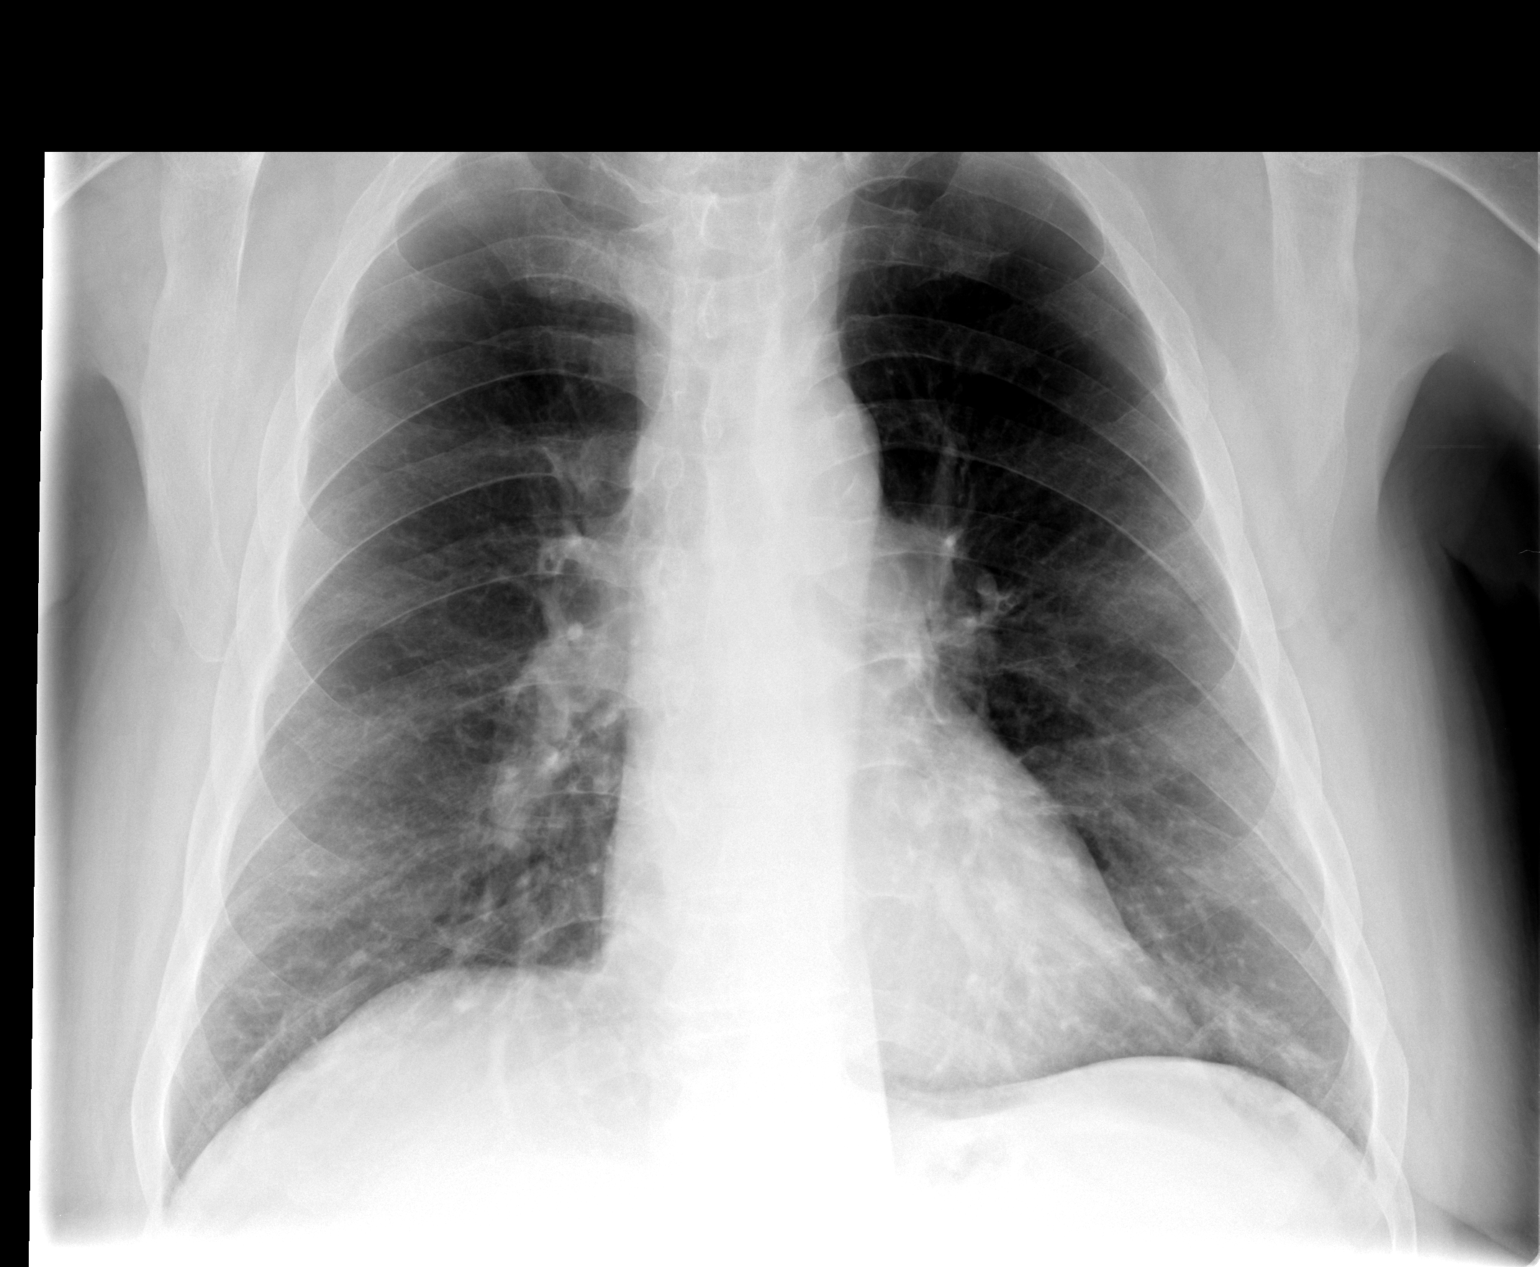

[view not recorded (2 of 2)]
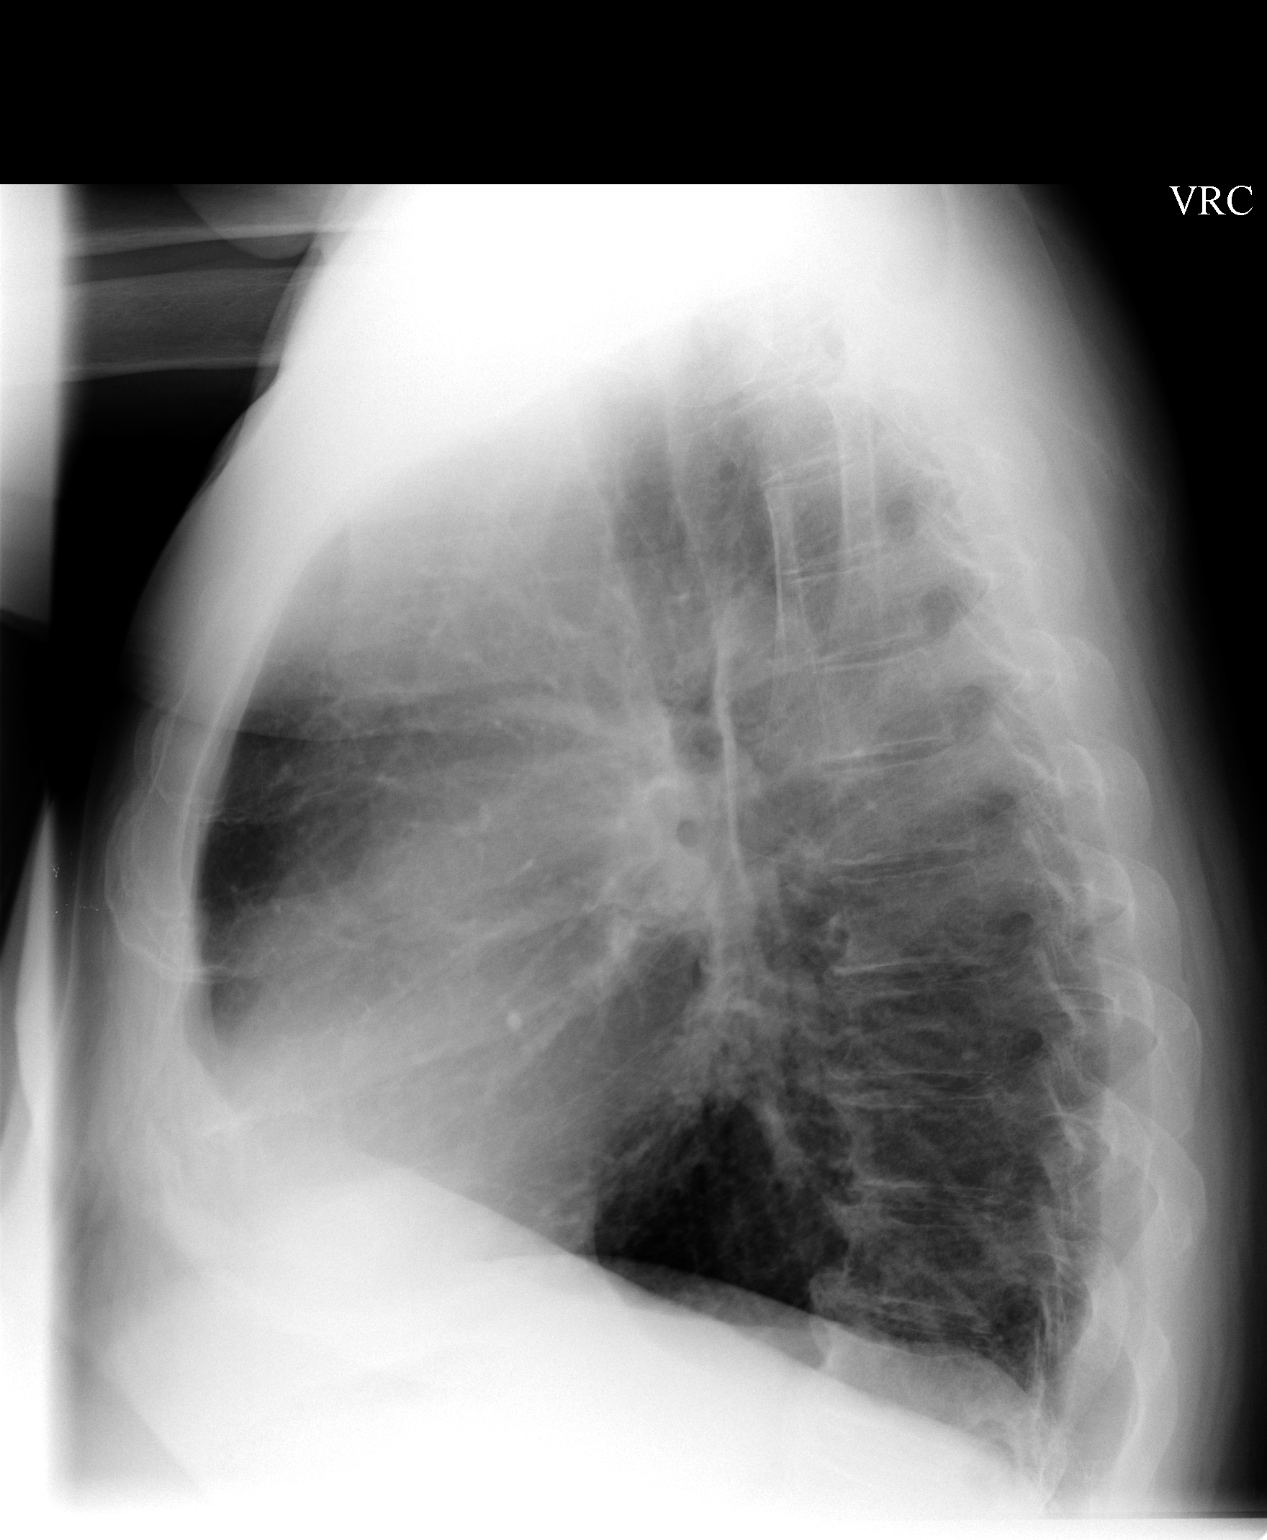

[2 of 2 positions shown; findings below may reference images not displayed]

FINDINGS: Hyperinflation compatible with COPD/emphysema. Normal heart size and
vascularity. No superimposed pneumonia, CHF, collapse or
consolidation. No effusion or pneumothorax. Trachea is midline.
Degenerative changes of the spine diffusely.
IMPRESSION: Hyperinflation. Stable exam. No superimposed acute process

## 2015-03-11 IMAGING — CR DG CHEST 1V PORT
1 series · 1 of 1 positions shown · non-contrast
Comparison: 11/29/2012; 02/23/2010; 11/02/2009

CLINICAL DATA: Shortness of breath, wheezing

EXAM:
PORTABLE CHEST - 1 VIEW

[portable]
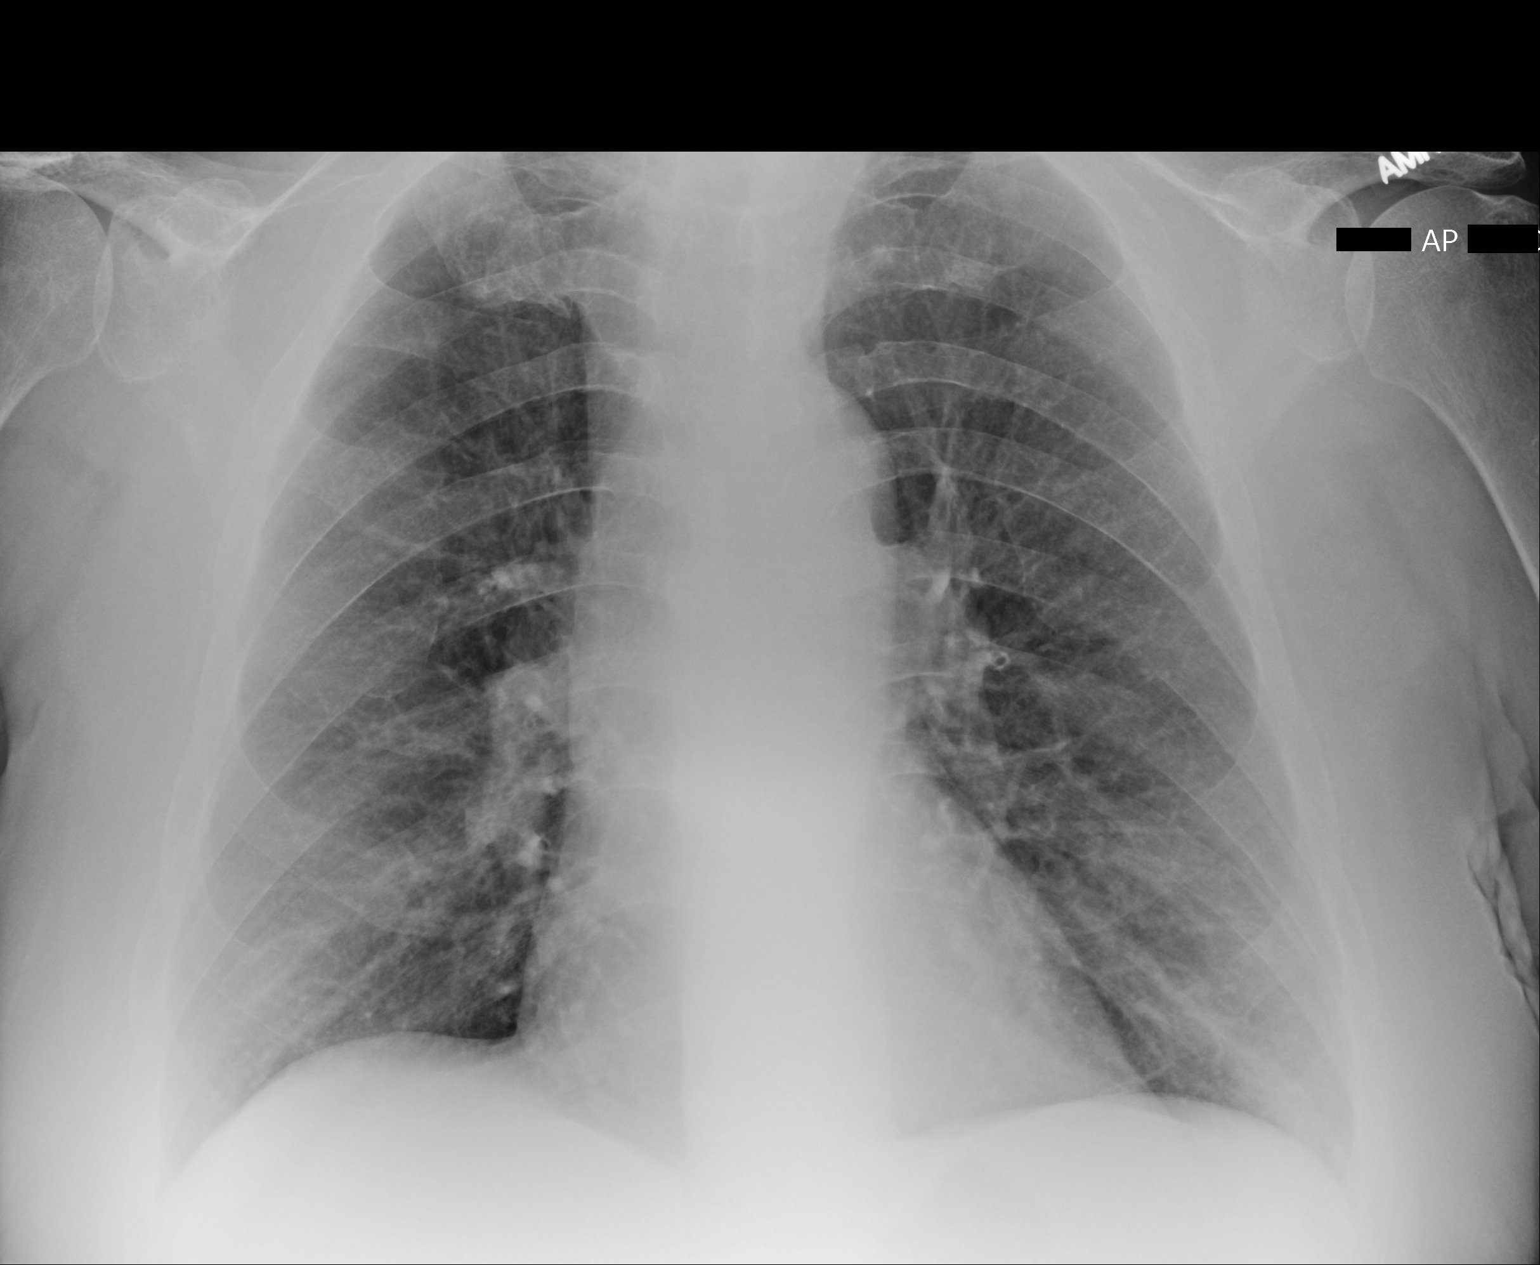

[1 of 1 positions shown; findings below may reference images not displayed]

FINDINGS: Grossly in borderline enlarged cardiac silhouette and mediastinal
contours with apparent difference is likely attributable to AP
projection. The lungs appear hyperexpanded with flattening of the
bilateral hemidiaphragms and mild diffuse slightly nodular
thickening of the pulmonary interstitium. Grossly unchanged minimal
left basilar linear heterogeneous opacities, likely atelectasis. No
new focal airspace opacities. No pleural effusion or pneumothorax.
No evidence of edema. Grossly unchanged bones.
IMPRESSION: Mild lung hyperexpansion of bronchitic change without definite acute
cardiopulmonary disease on this AP portable examination.

## 2015-12-28 ENCOUNTER — Emergency Department (HOSPITAL_COMMUNITY): Payer: Medicare Other

## 2015-12-28 ENCOUNTER — Emergency Department (HOSPITAL_COMMUNITY)
Admission: EM | Admit: 2015-12-28 | Discharge: 2015-12-29 | Disposition: A | Discharge status: Home / Self Care | Payer: Medicare Other | Attending: Emergency Medicine | Admitting: Emergency Medicine

## 2015-12-28 DIAGNOSIS — F209 Schizophrenia, unspecified: Secondary | ICD-10-CM | POA: Diagnosis not present

## 2015-12-28 DIAGNOSIS — R4182 Altered mental status, unspecified: Secondary | ICD-10-CM | POA: Diagnosis present

## 2015-12-28 DIAGNOSIS — Z7982 Long term (current) use of aspirin: Secondary | ICD-10-CM | POA: Diagnosis not present

## 2015-12-28 DIAGNOSIS — Z79899 Other long term (current) drug therapy: Secondary | ICD-10-CM | POA: Insufficient documentation

## 2015-12-28 LAB — ACETAMINOPHEN LEVEL

## 2015-12-28 LAB — URINALYSIS, ROUTINE W REFLEX MICROSCOPIC
BILIRUBIN URINE: NEGATIVE
HGB URINE DIPSTICK: NEGATIVE
KETONES UR: NEGATIVE mg/dL
Leukocytes, UA: NEGATIVE
Nitrite: NEGATIVE
PROTEIN: NEGATIVE mg/dL
Specific Gravity, Urine: 1.026 (ref 1.005–1.030)
pH: 5.5 (ref 5.0–8.0)

## 2015-12-28 LAB — I-STAT CG4 LACTIC ACID, ED
LACTIC ACID, VENOUS: 3.17 mmol/L — AB (ref 0.5–1.9)
LACTIC ACID, VENOUS: 1.56 mmol/L (ref 0.5–1.9)
Lactic Acid, Venous: 2 mmol/L (ref 0.5–1.9)

## 2015-12-28 LAB — I-STAT TROPONIN, ED: TROPONIN I, POC: 0 ng/mL (ref 0.00–0.08)

## 2015-12-28 LAB — RAPID URINE DRUG SCREEN, HOSP PERFORMED
AMPHETAMINES: NOT DETECTED
Barbiturates: NOT DETECTED
Benzodiazepines: NOT DETECTED
Cocaine: NOT DETECTED
Opiates: NOT DETECTED
Tetrahydrocannabinol: NOT DETECTED

## 2015-12-28 LAB — COMPREHENSIVE METABOLIC PANEL
ALT: 21 U/L (ref 17–63)
AST: 26 U/L (ref 15–41)
Albumin: 4 g/dL (ref 3.5–5.0)
Alkaline Phosphatase: 65 U/L (ref 38–126)
Anion gap: 9 (ref 5–15)
BILIRUBIN TOTAL: 0.5 mg/dL (ref 0.3–1.2)
BUN: 17 mg/dL (ref 6–20)
CO2: 21 mmol/L — ABNORMAL LOW (ref 22–32)
CREATININE: 0.88 mg/dL (ref 0.61–1.24)
Calcium: 9 mg/dL (ref 8.9–10.3)
Chloride: 106 mmol/L (ref 101–111)
GFR, EST AFRICAN AMERICAN: 59 mL/min — AB (ref >60)
GFR, EST NON AFRICAN AMERICAN: 51 mL/min — AB (ref >60)
Glucose, Bld: 330 mg/dL — ABNORMAL HIGH (ref 65–99)
POTASSIUM: 3.9 mmol/L (ref 3.5–5.1)
Sodium: 136 mmol/L (ref 135–145)
TOTAL PROTEIN: 7.2 g/dL (ref 6.5–8.1)

## 2015-12-28 LAB — ETHANOL

## 2015-12-28 LAB — URINE MICROSCOPIC-ADD ON

## 2015-12-28 LAB — SALICYLATE LEVEL

## 2015-12-28 LAB — CBC WITH DIFFERENTIAL/PLATELET
BASOS ABS: 0 10*3/uL (ref 0.0–0.1)
BASOS PCT: 0 %
EOS ABS: 0 10*3/uL (ref 0.0–0.7)
Eosinophils Relative: 0 %
HCT: 39.3 % (ref 39.0–52.0)
Hemoglobin: 13.1 g/dL (ref 13.0–17.0)
Lymphocytes Relative: 8 %
Lymphs Abs: 0.8 10*3/uL (ref 0.7–4.0)
MCH: 31.2 pg (ref 26.0–34.0)
MCHC: 33.3 g/dL (ref 30.0–36.0)
MCV: 93.6 fL (ref 78.0–100.0)
MONO ABS: 0.7 10*3/uL (ref 0.1–1.0)
MONOS PCT: 8 %
Neutro Abs: 7.7 10*3/uL (ref 1.7–7.7)
Neutrophils Relative %: 84 %
PLATELETS: 279 10*3/uL (ref 150–400)
RBC: 4.2 MIL/uL — ABNORMAL LOW (ref 4.22–5.81)
RDW: 13.3 % (ref 11.5–15.5)
WBC: 9.2 10*3/uL (ref 4.0–10.5)

## 2015-12-28 LAB — CBG MONITORING, ED: Glucose-Capillary: 157 mg/dL — ABNORMAL HIGH (ref 65–99)

## 2015-12-28 MED ORDER — SODIUM CHLORIDE 0.9 % IV BOLUS (SEPSIS)
500.0000 mL | Freq: Once | INTRAVENOUS | Status: AC
  Administered 2015-12-28: 500 mL via INTRAVENOUS

## 2015-12-28 MED ORDER — SIMVASTATIN 5 MG PO TABS
5.0000 mg | ORAL_TABLET | Freq: Every day | ORAL | Status: DC
  Administered 2015-12-28: 5 mg via ORAL
  Filled 2015-12-28: qty 1

## 2015-12-28 MED ORDER — FAMOTIDINE 20 MG PO TABS
40.0000 mg | ORAL_TABLET | Freq: Two times a day (BID) | ORAL | Status: DC
  Administered 2015-12-28: 20 mg via ORAL
  Filled 2015-12-28: qty 2

## 2015-12-28 MED ORDER — METFORMIN HCL 500 MG PO TABS
1000.0000 mg | ORAL_TABLET | Freq: Two times a day (BID) | ORAL | Status: DC
  Administered 2015-12-28 – 2015-12-29 (×2): 1000 mg via ORAL
  Filled 2015-12-28 (×2): qty 2

## 2015-12-28 MED ORDER — METOPROLOL SUCCINATE ER 25 MG PO TB24
25.0000 mg | ORAL_TABLET | Freq: Every day | ORAL | Status: DC
  Administered 2015-12-28: 25 mg via ORAL
  Filled 2015-12-28 (×3): qty 1

## 2015-12-28 MED ORDER — LISINOPRIL 5 MG PO TABS
5.0000 mg | ORAL_TABLET | Freq: Every day | ORAL | Status: DC
  Administered 2015-12-28: 5 mg via ORAL
  Filled 2015-12-28 (×3): qty 1

## 2015-12-28 MED ORDER — SODIUM CHLORIDE 0.9 % IV BOLUS (SEPSIS)
1000.0000 mL | Freq: Once | INTRAVENOUS | Status: AC
  Administered 2015-12-28: 1000 mL via INTRAVENOUS

## 2015-12-28 MED ORDER — RISPERIDONE 1 MG PO TABS
1.0000 mg | ORAL_TABLET | Freq: Three times a day (TID) | ORAL | Status: DC
  Administered 2015-12-28 (×2): 1 mg via ORAL
  Filled 2015-12-28 (×2): qty 1

## 2015-12-28 NOTE — ED Notes (Signed)
SW spoke with ED [employer] officer and he will contact a deputy to possibly fingerprint and try to find the identity of the patient.

## 2015-12-28 NOTE — Progress Notes (Signed)
Patient spoke with guardian [name] @ [phone]  Stacy GardnerErin Carleena Mires, [facility] Navicent HealthCSWA Clinical Social Worker [phone]

## 2015-12-28 NOTE — Progress Notes (Addendum)
9:56am- Call received from Sov[facility] Health DanvilleCassandra Massenburg, Owner of [employer] to see if patient was possibly in the ED. She stated patient was reported missing on yesterday. CSW informed her patient was not in the ED at this time.  CSW called back to speak with Mrs. Massenburg, as patient is now in the ED. She stated patient is IDD and [facility]. She stated [place] [facility] [place], [place], [place]. She stated the persons to contact at the [facility] home would be Rueben Bashlesha Sampson [phone] or Jaclyn ShaggyJonathan Seeber [phone]. She stated patient has a Care Coordinator as well. CSW placed Guardianship information in patient's chart by room and medication list was given to Nurse. Nurse updated.   Posey ReaLaVonia Adamaris King, LCSWA Clincial Social Worker [phone] 2:36 PM
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
## 2015-12-28 NOTE — ED Provider Notes (Signed)
11:00 PM patient has been cleared by psych/TTS to be discharge. They have seen and evaluated him and determined he is at his baseline. He has a history of schizophrenia. No focal findings on workup. Discharge home in the care of his caregivers.   Pricilla LovelessScott Jaidynn Balster, MD 12/28/15 2300

## 2015-12-28 NOTE — Progress Notes (Signed)
CSW spoke with Pacific MutualCassandra Massenburg (Owner of [employer]) who states patient has been diagnosed with IDD, schizophrenia, and Bipolar 1. Ms. [name] will be faxing CSW previous evaluations to add on file.   Ms. [name] can be contacted to represent as patients guardian.   [name]- [phone]  Stacy GardnerErin Johnsie Cruz, Gerald MajorsLCSWA Clinical Social Worker [phone]

## 2015-12-28 NOTE — ED Notes (Signed)
Bed: WA16 Expected date:  Expected time:  Means of arrival:  Comments: EMS/AMS 

## 2015-12-28 NOTE — ED Notes (Signed)
Patient changed into burgundy scrubs and wanded by security. Beloingings bags x 3 locked in locker #30.
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
## 2015-12-28 NOTE — Progress Notes (Signed)
This Clinical research associatewriter spoke with Lillia AbedLindsay, RN and Reita ClicheBobby, RN about the TTS consult in the system.  It was reported that the CSW department has been working on this case since admission and it was discovered that the patient is at baseline.  Although it was suggested the patient is at baseline it was asked for the TTS to complete the assessment.  This Clinical research associatewriter communicated with Malcolm MetroJoanna, AC and Arnettina, [facility].       Maryelizabeth Rowanressa Enyah Moman, MSW, Tinnie GensLCSW, LCAS, CCSI [facility] Triage Specialist [phone]
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
## 2015-12-28 NOTE — Progress Notes (Signed)
Staffed with EDP, needing assistance with identification of patient.   CSW staffed with Nurse, as patient is unable to provide any information at this time. CSW informed Nurse information will be obtained from officer for assistance.  Staffed with Officer Forte with [employer] who states he would put in a message for a deputy to come and possibly fingerprint patient for identification purposes.   12:20pm- Spoke with Nucor Corporationfficer Forte with [employer] and he stated a deputy was in route to the hospital for assistance with identification of patient. Nurse updated.    Gerald Cruz, LCSWA Clincial Social Worker [phone] 12:26 PM

## 2015-12-29 DIAGNOSIS — F209 Schizophrenia, unspecified: Secondary | ICD-10-CM | POA: Diagnosis not present

## 2015-12-29 NOTE — ED Notes (Signed)
Cassandra from  [employer] contacted r/t pt discharge home unable to provide transportation at this time but will be able to transport at 0900 12/29/15. Pt care giver can be contacted at [phone] and pt guardian "[name]" can be contacted at [phone]

## 2016-10-09 ENCOUNTER — Encounter (HOSPITAL_COMMUNITY): Payer: Self-pay

## 2016-10-09 ENCOUNTER — Emergency Department (HOSPITAL_COMMUNITY)
Admission: EM | Admit: 2016-10-09 | Discharge: 2016-10-09 | Disposition: A | Discharge status: Home / Self Care | Payer: Medicare Other | Attending: Emergency Medicine | Admitting: Emergency Medicine

## 2016-10-09 DIAGNOSIS — F17210 Nicotine dependence, cigarettes, uncomplicated: Secondary | ICD-10-CM | POA: Insufficient documentation

## 2016-10-09 DIAGNOSIS — I10 Essential (primary) hypertension: Secondary | ICD-10-CM | POA: Insufficient documentation

## 2016-10-09 DIAGNOSIS — J449 Chronic obstructive pulmonary disease, unspecified: Secondary | ICD-10-CM | POA: Diagnosis not present

## 2016-10-09 DIAGNOSIS — R55 Syncope and collapse: Secondary | ICD-10-CM | POA: Diagnosis not present

## 2016-10-09 DIAGNOSIS — G20 Parkinson's disease: Secondary | ICD-10-CM | POA: Diagnosis not present

## 2016-10-09 DIAGNOSIS — Z794 Long term (current) use of insulin: Secondary | ICD-10-CM | POA: Insufficient documentation

## 2016-10-09 DIAGNOSIS — Z79899 Other long term (current) drug therapy: Secondary | ICD-10-CM | POA: Insufficient documentation

## 2016-10-09 DIAGNOSIS — E119 Type 2 diabetes mellitus without complications: Secondary | ICD-10-CM | POA: Diagnosis not present

## 2016-10-09 DIAGNOSIS — F79 Unspecified intellectual disabilities: Secondary | ICD-10-CM | POA: Insufficient documentation

## 2016-10-09 DIAGNOSIS — Z7982 Long term (current) use of aspirin: Secondary | ICD-10-CM | POA: Insufficient documentation

## 2016-10-09 LAB — I-STAT CHEM 8, ED
BUN: 24 mg/dL — ABNORMAL HIGH (ref 6–20)
CALCIUM ION: 1.24 mmol/L (ref 1.15–1.40)
CREATININE: 0.6 mg/dL — AB (ref 0.61–1.24)
Chloride: 103 mmol/L (ref 101–111)
Glucose, Bld: 100 mg/dL — ABNORMAL HIGH (ref 65–99)
HCT: 36 % — ABNORMAL LOW (ref 39.0–52.0)
Hemoglobin: 12.2 g/dL — ABNORMAL LOW (ref 13.0–17.0)
Potassium: 4.5 mmol/L (ref 3.5–5.1)
Sodium: 139 mmol/L (ref 135–145)
TCO2: 28 mmol/L (ref 22–32)

## 2016-10-09 NOTE — ED Triage Notes (Signed)
Per EMS, patient comes from a group home.  He went to therapy this morning in office to get Invega IM injection. Post injection patient passed out. He has had the medication before 3 months ago, but not at this group home. In route EMS attempted IVx2 and pt passed out both times. Hx COPD, diabetes. BG 125. Caretakers are on way with paperwork.

## 2016-10-09 NOTE — ED Provider Notes (Signed)
[facility]-EMERGENCY DEPT Provider Note   CSN: [id number] Arrival date & time: 10/09/16  1050     History   Chief Complaint Chief Complaint  Patient presents with  . Loss of Consciousness    HPI Gerald Cruz is a 66 y.o. male.  HPI Patient got his Invega  shot by nursing staff at his group home. Patient's nurse is with him at this time. She reports that she had him wait for 15 minutes. She reports after a few minutes he lay down and was not responding. She reports during that time he had good palpable pulse. He was breathing. His color remained good. After about 4 minutes he was back to baseline. EMS was contacted. Report is that during EMS transport, patient passed out each time they attempted an IV. At this time, patient is alert and he has no complaints. He denies any shortness of breath or chest pain.Caregivers are present with him and reports that he has been well leading up to this episode. He has been active and eating as per usual. He has not been having any complaints of chest pain, shortness of breath or other symptoms. Staff does advise that patient's family members have made them aware that during contact with medical staff, patient has had propensity to significantly exaggerate symptoms. Past Medical History:  Diagnosis Date  . COPD (chronic obstructive pulmonary disease) (HCC)   . Diabetes mellitus   . GERD (gastroesophageal reflux disease)   . Hypercholesterolemia   . Hypertension   . Migraines   . Mild mental retardation   . OA (osteoarthritis)   . Parkinson disease (HCC)   . Schizoaffective disorder     Patient Active Problem List   Diagnosis Date Noted  . COPD (chronic obstructive pulmonary disease) (HCC) 11/29/2012  . Diabetes mellitus, type 2 (HCC) 11/29/2012  . Schizoaffective disorder (HCC) 11/29/2012  . GERD (gastroesophageal reflux disease)   . Hypertension   . Hypercholesterolemia   . Encounter for screening colonoscopy 09/12/2010    Past Surgical  History:  Procedure Laterality Date  . APPENDECTOMY    . VEIN LIGATION AND STRIPPING         Home Medications    Prior to Admission medications   Medication Sig Start Date End Date Taking? Authorizing Provider  acetaminophen (TYLENOL) 325 MG tablet Take 2 tablets (650 mg total) by mouth every 8 (eight) hours as needed (As needed for pain). 01/29/13  Yes Mancel Bale, MD  aspirin 81 MG chewable tablet Chew 1 tablet (81 mg total) by mouth daily. 01/29/13  Yes Mancel Bale, MD  budesonide-formoterol [facility]) 80-4.5 MCG/ACT inhaler Inhale 2 puffs into the lungs 2 (two) times daily. 01/29/13  Yes Mancel Bale, MD  Dulaglutide (TRULICITY) 0.75 MG/0.5ML SOPN Inject 1 each into the skin every 7 (seven) days.   Yes [provider]  famotidine (PEPCID) 20 MG tablet Take 40 mg by mouth 2 (two) times daily.   Yes [provider]  fenofibrate 160 MG tablet Take 1 tablet (160 mg total) by mouth at bedtime. 01/29/13  Yes Mancel Bale, MD  gabapentin (NEURONTIN) 300 MG capsule Take 1 capsule (300 mg total) by mouth at bedtime. 01/29/13  Yes Mancel Bale, MD  insulin detemir (LEVEMIR) 100 UNIT/ML injection Inject 0.65 mLs (65 Units total) into the skin at bedtime. Patient taking differently: Inject 100 Units into the skin at bedtime.  01/29/13  Yes Mancel Bale, MD  lisinopril (PRINIVIL,ZESTRIL) 5 MG tablet Take 5 mg by mouth every morning.  Yes [provider]  loperamide (IMODIUM A-D) 2 MG capsule Take 2 mg by mouth 2 (two) times daily as needed for diarrhea or loose stools.   Yes [provider]  Melatonin 3 MG CAPS Take 3 mg by mouth daily.   Yes [provider]  metFORMIN (GLUCOPHAGE) 1000 MG tablet Take 1 tablet (1,000 mg total) by mouth 2 (two) times daily. 01/29/13  Yes Mancel Bale, MD  metoprolol succinate (TOPROL-XL) 25 MG 24 hr tablet Take 25 mg by mouth every morning.   Yes [provider]  mirtazapine (REMERON) 15 MG tablet Take 15 mg  by mouth at bedtime.   Yes [provider]  Paliperidone Palmitate (INVEGA TRINZA) 410 MG/1.315ML SUSP Inject 1 each into the muscle every 3 (three) months.   Yes [provider]  simvastatin (ZOCOR) 5 MG tablet Take 1 tablet (5 mg total) by mouth daily at 6 PM. 01/29/13  Yes Mancel Bale, MD  tiotropium (SPIRIVA) 18 MCG inhalation capsule Place 1 capsule (18 mcg total) into inhaler and inhale daily. 01/29/13  Yes Mancel Bale, MD  traZODone (DESYREL) 100 MG tablet Take 100 mg by mouth 3 (three) times daily.   Yes [provider]  aspirin 81 MG chewable tablet Chew 1 tablet (81 mg total) by mouth daily. Patient not taking: Reported on 10/09/2016 01/29/13   Mancel Bale, MD  cetirizine (ZYRTEC) 10 MG tablet Take 10 mg by mouth every morning.    [provider]  cyanocobalamin (,VITAMIN B-12,) 1000 MCG/ML injection Inject 1 mL (1,000 mcg total) into the muscle every 30 (thirty) days. Patient not taking: Reported on 10/09/2016 01/29/13   Mancel Bale, MD  docusate sodium 100 MG CAPS Take 100 mg by mouth 2 (two) times daily as needed for mild constipation. Patient not taking: Reported on 10/09/2016 01/29/13   Mancel Bale, MD  famotidine (PEPCID) 40 MG tablet Take 1 tablet (40 mg total) by mouth 2 (two) times daily. Patient not taking: Reported on 10/09/2016 01/29/13   Mancel Bale, MD  ibuprofen (ADVIL,MOTRIN) 200 MG tablet Take 2 tablets (400 mg total) by mouth every 8 (eight) hours as needed for mild pain or moderate pain. Patient not taking: Reported on 10/09/2016 01/29/13   Mancel Bale, MD  insulin aspart (NOVOLOG) 100 UNIT/ML injection Inject 14 Units into the skin 3 (three) times daily before meals. Patient not taking: Reported on 10/09/2016 01/29/13   Mancel Bale, MD  ipratropium-albuterol (DUONEB) 0.5-2.5 (3) MG/3ML SOLN Take 3 mLs by nebulization every 4 (four) hours. Patient not taking: Reported on 10/09/2016 01/29/13   Mancel Bale, MD  lisinopril  (PRINIVIL,ZESTRIL) 5 MG tablet Take 1 tablet (5 mg total) by mouth every morning. Patient not taking: Reported on 10/09/2016 01/29/13   Mancel Bale, MD  loratadine (CLARITIN) 10 MG tablet Take 1 tablet (10 mg total) by mouth daily as needed for allergies, rhinitis or itching. Patient not taking: Reported on 10/09/2016 01/29/13   Mancel Bale, MD  LORazepam (ATIVAN) 1 MG tablet Take 1 tablet (1 mg total) by mouth every 6 (six) hours as needed for anxiety or sedation. Patient not taking: Reported on 10/09/2016 01/29/13   Mancel Bale, MD  metoprolol succinate (TOPROL-XL) 25 MG 24 hr tablet Take 1 tablet (25 mg total) by mouth every morning. Patient not taking: Reported on 10/09/2016 01/29/13   Mancel Bale, MD  risperiDONE (RISPERDAL M-TABS) 1 MG disintegrating tablet Take 1 tablet (1 mg total) by mouth 3 (three) times daily. Patient not taking: Reported  on 10/09/2016 01/29/13   Mancel Bale, MD    Family History Family History  Problem Relation Age of Onset  . Heart attack Unknown        Mother, deceased  . Cancer Brother        unsure what type    Social History Social History  Substance Use Topics  . Smoking status: Current Every Day Smoker    Packs/day: 2.00    Years: 54.00    Types: Cigarettes  . Smokeless tobacco: Not on file  . Alcohol use No     Allergies   Patient has no known allergies.   Review of Systems Review of Systems 10 Systems reviewed and are negative for acute change except as noted in the HPI.   Physical Exam Updated Vital Signs BP 131/81 (BP Location: Left Arm)   Pulse 77   Temp 97.7 F (36.5 C) (Oral)   Resp 17   SpO2 93%   Physical Exam  Constitutional:  Patient is alert and nontoxic. He has no respiratory distress. He is conversing with me. He has evident speech patterns that are consistent with developmental delay but he is animated and interactive.  HENT:  Head: Normocephalic and atraumatic.  Nose: Nose normal.  Mouth/Throat: Oropharynx is  clear and moist.  Eyes: Pupils are equal, round, and reactive to light. EOM are normal.  Neck: Neck supple.  Cardiovascular: Normal rate and regular rhythm.   Pulmonary/Chest: Effort normal and breath sounds normal.  Abdominal: Soft. He exhibits no distension. There is no tenderness. There is no guarding.  Musculoskeletal: Normal range of motion. He exhibits no edema or tenderness.  Patient has changes consistent with arthritis of the joints but no peripheral edema or calf tenderness. Skin condition is good.  Neurological: He is alert. No cranial nerve deficit. He exhibits normal muscle tone. Coordination normal.  Patient is alert and interactive. He is following all commands. Grip strength upper extremities 5\5.He will let command elevate each leg independently off the bed and hold against resistance.  Skin: Skin is warm and dry.  Psychiatric: He has a normal mood and affect.     ED Treatments / Results  Labs (all labs ordered are listed, but only abnormal results are displayed) Labs Reviewed  I-STAT CHEM 8, ED - Abnormal; Notable for the following:       Result Value   BUN 24 (*)    Creatinine, Ser 0.60 (*)    Glucose, Bld 100 (*)    Hemoglobin 12.2 (*)    HCT 36.0 (*)    All other components within normal limits    EKG  EKG Interpretation  Date/Time:  Thursday October 09 2016 11:28:09 EDT Ventricular Rate:  76 PR Interval:    QRS Duration: 124 QT Interval:  417 QTC Calculation: 469 R Axis:   -64 Text Interpretation:  Sinus rhythm Left bundle branch block no significant change from previous. Confirmed by Arby Barrette [phone]) on 10/09/2016 12:05:43 PM       Radiology No results found.  Procedures Procedures (including critical care time)  Medications Ordered in ED Medications - No data to display   Initial Impression / Assessment and Plan / ED Course  I have reviewed the triage vital signs and the nursing notes.  Pertinent labs & imaging results that were  available during my care of the patient were reviewed by me and considered in my medical decision making (see chart for details).     Final Clinical Impressions(s) / ED  Diagnoses   Final diagnoses:  Vasovagal syncope  Patient clinically well. The nurse administered the patient's and big shot is present with the patient. She describes him as having steady vital signs and respirations throughout this event. Patient also had 2 similar events of "LOC" during IV attempt by EMS. Findings are consistent with vasovagal syncope. Patient is well and has no complaints. He denies chest pain or shortness of breath. There had been no recently observe changes in his baseline function. At this time I feel he is stable for discharge.  New Prescriptions New Prescriptions   No medications on file     Arby BarrettePfeiffer, Daelen Belvedere, MD 10/09/16 1222

## 2016-10-09 NOTE — ED Notes (Signed)
Bed: [id number] Expected date:  Expected time:  Means of arrival:  Comments: EMS- 66yo M, syncope

## 2017-03-15 ENCOUNTER — Emergency Department (HOSPITAL_COMMUNITY)
Admission: EM | Admit: 2017-03-15 | Discharge: 2017-03-15 | Disposition: A | Discharge status: Home / Self Care | Payer: Medicare Other | Attending: Emergency Medicine | Admitting: Emergency Medicine

## 2017-03-15 ENCOUNTER — Other Ambulatory Visit: Payer: Self-pay

## 2017-03-15 ENCOUNTER — Encounter (HOSPITAL_COMMUNITY): Payer: Self-pay | Admitting: Emergency Medicine

## 2017-03-15 DIAGNOSIS — F819 Developmental disorder of scholastic skills, unspecified: Secondary | ICD-10-CM | POA: Insufficient documentation

## 2017-03-15 DIAGNOSIS — I10 Essential (primary) hypertension: Secondary | ICD-10-CM | POA: Insufficient documentation

## 2017-03-15 DIAGNOSIS — F17210 Nicotine dependence, cigarettes, uncomplicated: Secondary | ICD-10-CM | POA: Insufficient documentation

## 2017-03-15 DIAGNOSIS — E119 Type 2 diabetes mellitus without complications: Secondary | ICD-10-CM | POA: Insufficient documentation

## 2017-03-15 DIAGNOSIS — G20 Parkinson's disease: Secondary | ICD-10-CM | POA: Diagnosis not present

## 2017-03-15 DIAGNOSIS — J449 Chronic obstructive pulmonary disease, unspecified: Secondary | ICD-10-CM | POA: Insufficient documentation

## 2017-03-15 DIAGNOSIS — M25569 Pain in unspecified knee: Secondary | ICD-10-CM | POA: Diagnosis present

## 2017-03-15 DIAGNOSIS — M170 Bilateral primary osteoarthritis of knee: Secondary | ICD-10-CM | POA: Insufficient documentation

## 2017-03-15 DIAGNOSIS — Z7982 Long term (current) use of aspirin: Secondary | ICD-10-CM | POA: Insufficient documentation

## 2017-03-15 DIAGNOSIS — Z794 Long term (current) use of insulin: Secondary | ICD-10-CM | POA: Insufficient documentation

## 2017-03-15 NOTE — ED Provider Notes (Signed)
[facility]-EMERGENCY DEPT Provider Note   CSN: [id number] Arrival date & time: 03/15/17  2003     History   Chief Complaint Chief Complaint  Patient presents with  . Knee Pain    HPI Gerald Cruz is a 67 y.o. male.  Patient is a 67 year old male with a history of mild MR, COPD, diabetes, hypertension, Parkinson's disease who recently saw his doctor this past week and had imaging of his knees that showed arthritis and started on a new pain medication presenting by EMS for bilateral knee pain.  Staff at the facility where he lives states that he almost fell to the floor complaining that his knees hurt.  A staff member with him here thinks that the patient just wanted to leave the facility.  Patient is stating that the right he knee hurts more than the left but he also was not using his cane.  Staff states since starting the new medication he has not complained all week.  No fever or swelling of the knees.   The history is provided by the patient and a caregiver.  Knee Pain   This is a recurrent problem. Episode onset: 2 weeks. The problem occurs constantly. The problem has not changed since onset.The pain is present in the left knee and right knee. The quality of the pain is described as aching. The pain is at a severity of 4/10. The pain is moderate. Associated symptoms include stiffness. Pertinent negatives include full range of motion. The symptoms are aggravated by activity. Treatments tried: nambetol started 1 weeks ago. There has been no history of extremity trauma. arthritis    Past Medical History:  Diagnosis Date  . COPD (chronic obstructive pulmonary disease) (HCC)   . Diabetes mellitus   . GERD (gastroesophageal reflux disease)   . Hypercholesterolemia   . Hypertension   . Migraines   . Mild mental retardation   . OA (osteoarthritis)   . Parkinson disease (HCC)   . Schizoaffective disorder     Patient Active Problem List   Diagnosis Date  Noted  . COPD (chronic obstructive pulmonary disease) (HCC) 11/29/2012  . Diabetes mellitus, type 2 (HCC) 11/29/2012  . Schizoaffective disorder (HCC) 11/29/2012  . GERD (gastroesophageal reflux disease)   . Hypertension   . Hypercholesterolemia   . Encounter for screening colonoscopy 09/12/2010    Past Surgical History:  Procedure Laterality Date  . APPENDECTOMY    . VEIN LIGATION AND STRIPPING         Home Medications    Prior to Admission medications   Medication Sig Start Date End Date Taking? Authorizing Provider  acetaminophen (TYLENOL) 325 MG tablet Take 2 tablets (650 mg total) by mouth every 8 (eight) hours as needed (As needed for pain). 01/29/13   Mancel Bale, MD  aspirin 81 MG chewable tablet Chew 1 tablet (81 mg total) by mouth daily. 01/29/13   Mancel Bale, MD  aspirin 81 MG chewable tablet Chew 1 tablet (81 mg total) by mouth daily. Patient not taking: Reported on 10/09/2016 01/29/13   Mancel Bale, MD  budesonide-formoterol [facility]) 80-4.5 MCG/ACT inhaler Inhale 2 puffs into the lungs 2 (two) times daily. 01/29/13   Mancel Bale, MD  cetirizine (ZYRTEC) 10 MG tablet Take 10 mg by mouth every morning.    [provider]  cyanocobalamin (,VITAMIN B-12,) 1000 MCG/ML injection Inject 1 mL (1,000 mcg total) into the muscle every 30 (thirty) days. Patient not taking: Reported on 10/09/2016 01/29/13   Effie Shy,  Mechele CollinElliott, MD  docusate sodium 100 MG CAPS Take 100 mg by mouth 2 (two) times daily as needed for mild constipation. Patient not taking: Reported on 10/09/2016 01/29/13   Mancel BaleWentz, Elliott, MD  Dulaglutide (TRULICITY) 0.75 MG/0.5ML SOPN Inject 1 each into the skin every 7 (seven) days.    [provider]  famotidine (PEPCID) 20 MG tablet Take 40 mg by mouth 2 (two) times daily.    [provider]  famotidine (PEPCID) 40 MG tablet Take 1 tablet (40 mg total) by mouth 2 (two) times daily. Patient not taking: Reported on 10/09/2016 01/29/13   Mancel BaleWentz,  Elliott, MD  fenofibrate 160 MG tablet Take 1 tablet (160 mg total) by mouth at bedtime. 01/29/13   Mancel BaleWentz, Elliott, MD  gabapentin (NEURONTIN) 300 MG capsule Take 1 capsule (300 mg total) by mouth at bedtime. 01/29/13   Mancel BaleWentz, Elliott, MD  ibuprofen (ADVIL,MOTRIN) 200 MG tablet Take 2 tablets (400 mg total) by mouth every 8 (eight) hours as needed for mild pain or moderate pain. Patient not taking: Reported on 10/09/2016 01/29/13   Mancel BaleWentz, Elliott, MD  insulin aspart (NOVOLOG) 100 UNIT/ML injection Inject 14 Units into the skin 3 (three) times daily before meals. Patient not taking: Reported on 10/09/2016 01/29/13   Mancel BaleWentz, Elliott, MD  insulin detemir (LEVEMIR) 100 UNIT/ML injection Inject 0.65 mLs (65 Units total) into the skin at bedtime. Patient taking differently: Inject 100 Units into the skin at bedtime.  01/29/13   Mancel BaleWentz, Elliott, MD  ipratropium-albuterol (DUONEB) 0.5-2.5 (3) MG/3ML SOLN Take 3 mLs by nebulization every 4 (four) hours. Patient not taking: Reported on 10/09/2016 01/29/13   Mancel BaleWentz, Elliott, MD  lisinopril (PRINIVIL,ZESTRIL) 5 MG tablet Take 5 mg by mouth every morning.    [provider]  lisinopril (PRINIVIL,ZESTRIL) 5 MG tablet Take 1 tablet (5 mg total) by mouth every morning. Patient not taking: Reported on 10/09/2016 01/29/13   Mancel BaleWentz, Elliott, MD  loperamide (IMODIUM A-D) 2 MG capsule Take 2 mg by mouth 2 (two) times daily as needed for diarrhea or loose stools.    [provider]  loratadine (CLARITIN) 10 MG tablet Take 1 tablet (10 mg total) by mouth daily as needed for allergies, rhinitis or itching. Patient not taking: Reported on 10/09/2016 01/29/13   Mancel BaleWentz, Elliott, MD  LORazepam (ATIVAN) 1 MG tablet Take 1 tablet (1 mg total) by mouth every 6 (six) hours as needed for anxiety or sedation. Patient not taking: Reported on 10/09/2016 01/29/13   Mancel BaleWentz, Elliott, MD  Melatonin 3 MG CAPS Take 3 mg by mouth daily.    [provider]  metFORMIN (GLUCOPHAGE) 1000 MG  tablet Take 1 tablet (1,000 mg total) by mouth 2 (two) times daily. 01/29/13   Mancel BaleWentz, Elliott, MD  metoprolol succinate (TOPROL-XL) 25 MG 24 hr tablet Take 25 mg by mouth every morning.    [provider]  metoprolol succinate (TOPROL-XL) 25 MG 24 hr tablet Take 1 tablet (25 mg total) by mouth every morning. Patient not taking: Reported on 10/09/2016 01/29/13   Mancel BaleWentz, Elliott, MD  mirtazapine (REMERON) 15 MG tablet Take 15 mg by mouth at bedtime.    [provider]  Paliperidone Palmitate (INVEGA TRINZA) 410 MG/1.315ML SUSP Inject 1 each into the muscle every 3 (three) months.    [provider]  risperiDONE (RISPERDAL M-TABS) 1 MG disintegrating tablet Take 1 tablet (1 mg total) by mouth 3 (three) times daily. Patient not taking: Reported on 10/09/2016 01/29/13   Mancel BaleWentz, Elliott, MD  simvastatin (ZOCOR) 5 MG tablet Take 1 tablet (5 mg total) by mouth daily at 6 PM. 01/29/13   Mancel Bale, MD  tiotropium (SPIRIVA) 18 MCG inhalation capsule Place 1 capsule (18 mcg total) into inhaler and inhale daily. 01/29/13   Mancel Bale, MD  traZODone (DESYREL) 100 MG tablet Take 100 mg by mouth 3 (three) times daily.    [provider]    Family History Family History  Problem Relation Age of Onset  . Heart attack Unknown        Mother, deceased  . Cancer Brother        unsure what type    Social History Social History   Tobacco Use  . Smoking status: Current Every Day Smoker    Packs/day: 2.00    Years: 54.00    Pack years: 108.00    Types: Cigarettes  Substance Use Topics  . Alcohol use: No  . Drug use: No     Allergies   Patient has no known allergies.   Review of Systems Review of Systems  Musculoskeletal: Positive for stiffness.  All other systems reviewed and are negative.    Physical Exam Updated Vital Signs BP 110/76 (BP Location: Left Arm)   Pulse 74   Resp 18   Ht 5\' 8"  (1.727 m)   Wt 86.2 kg (190 lb)   SpO2 100%   BMI 28.89 kg/m    Physical Exam  Constitutional: He is oriented to person, place, and time. He appears well-developed and well-nourished. No distress.  HENT:  Head: Normocephalic and atraumatic.  Eyes: EOM are normal. Pupils are equal, round, and reactive to light.  Cardiovascular: Normal rate, regular rhythm and normal heart sounds.  Pulmonary/Chest: Effort normal and breath sounds normal. No respiratory distress. He has no wheezes. He has no rales.  Musculoskeletal:       Right knee: Normal. He exhibits no swelling, no effusion and no ecchymosis.       Left knee: Normal. He exhibits no swelling and no effusion.  Neurological: He is alert and oriented to person, place, and time.  Skin: Skin is warm and dry.  Nursing note and vitals reviewed.    ED Treatments / Results  Labs (all labs ordered are listed, but only abnormal results are displayed) Labs Reviewed - No data to display  EKG  EKG Interpretation None       Radiology No results found.  Procedures Procedures (including critical care time)  Medications Ordered in ED Medications - No data to display   Initial Impression / Assessment and Plan / ED Course  I have reviewed the triage vital signs and the nursing notes.  Pertinent labs & imaging results that were available during my care of the patient were reviewed by me and considered in my medical decision making (see chart for details).     Patient presenting with bilateral knee pain right greater than left with a recent diagnosis of arthritis and imaging done by PCP.  Patient states his knees were just hurting tonight.  However patient has full range of motion of his knees without difficulty.  He has no erythema, swelling or localized tenderness of his knees.  Staff at the facility where he lives thinks that he just wanted to leave the facility.  He is recently started on a new medication that is seem to significantly help with his pain and he was not using his cane tonight.   Patient was placed in a knee sleeves and discharged  home with his staff member. Final Clinical Impressions(s) / ED Diagnoses   Final diagnoses:  Primary osteoarthritis of both knees    ED Discharge Orders    None       Gwyneth Sprout, MD 03/15/17 2040

## 2017-03-15 NOTE — ED Notes (Signed)
Knee supports placed. Patient states they feel comfortable and non-restricting.

## 2017-03-15 NOTE — ED Triage Notes (Signed)
Pt presents by EMS for bilateral knee pain and caregiver reported that pt was about to fall and then was lowered to the ground with no injury. Pt developmentally delayed. Pt has medications for knee pain for appx 1 week.

## 2017-08-12 ENCOUNTER — Encounter (HOSPITAL_COMMUNITY): Payer: Self-pay

## 2017-08-12 ENCOUNTER — Emergency Department (HOSPITAL_COMMUNITY): Payer: Medicare Other

## 2017-08-12 ENCOUNTER — Emergency Department (HOSPITAL_COMMUNITY)
Admission: EM | Admit: 2017-08-12 | Discharge: 2017-08-13 | Disposition: A | Discharge status: Home / Self Care | Payer: Medicare Other | Attending: Emergency Medicine | Admitting: Emergency Medicine

## 2017-08-12 DIAGNOSIS — R45851 Suicidal ideations: Secondary | ICD-10-CM | POA: Diagnosis not present

## 2017-08-12 DIAGNOSIS — F250 Schizoaffective disorder, bipolar type: Secondary | ICD-10-CM | POA: Diagnosis not present

## 2017-08-12 DIAGNOSIS — R4182 Altered mental status, unspecified: Secondary | ICD-10-CM | POA: Diagnosis present

## 2017-08-12 DIAGNOSIS — F209 Schizophrenia, unspecified: Secondary | ICD-10-CM

## 2017-08-12 DIAGNOSIS — F71 Moderate intellectual disabilities: Secondary | ICD-10-CM | POA: Insufficient documentation

## 2017-08-12 LAB — CBC WITH DIFFERENTIAL/PLATELET
ABS IMMATURE GRANULOCYTES: 0.1 10*3/uL (ref 0.0–0.1)
BASOS ABS: 0 10*3/uL (ref 0.0–0.1)
BASOS PCT: 1 %
Eosinophils Absolute: 0.1 10*3/uL (ref 0.0–0.7)
Eosinophils Relative: 1 %
HCT: 35.1 % — ABNORMAL LOW (ref 39.0–52.0)
Hemoglobin: 11.4 g/dL — ABNORMAL LOW (ref 13.0–17.0)
IMMATURE GRANULOCYTES: 1 %
Lymphocytes Relative: 10 %
Lymphs Abs: 0.7 10*3/uL (ref 0.7–4.0)
MCH: 32.6 pg (ref 26.0–34.0)
MCHC: 32.5 g/dL (ref 30.0–36.0)
MCV: 100.3 fL — AB (ref 78.0–100.0)
MONOS PCT: 6 %
Monocytes Absolute: 0.4 10*3/uL (ref 0.1–1.0)
NEUTROS ABS: 5.3 10*3/uL (ref 1.7–7.7)
NEUTROS PCT: 81 %
PLATELETS: 208 10*3/uL (ref 150–400)
RBC: 3.5 MIL/uL — ABNORMAL LOW (ref 4.22–5.81)
RDW: 12.4 % (ref 11.5–15.5)
WBC: 6.5 10*3/uL (ref 4.0–10.5)

## 2017-08-12 LAB — COMPREHENSIVE METABOLIC PANEL
ALBUMIN: 3.5 g/dL (ref 3.5–5.0)
ALT: 20 U/L (ref 0–44)
AST: 20 U/L (ref 15–41)
Alkaline Phosphatase: 33 U/L — ABNORMAL LOW (ref 38–126)
Anion gap: 7 (ref 5–15)
BUN: 10 mg/dL (ref 8–23)
CHLORIDE: 109 mmol/L (ref 98–111)
CO2: 25 mmol/L (ref 22–32)
CREATININE: 0.67 mg/dL (ref 0.61–1.24)
Calcium: 8.8 mg/dL — ABNORMAL LOW (ref 8.9–10.3)
GFR calc Af Amer: >60 mL/min (ref >60)
GFR calc non Af Amer: >60 mL/min (ref >60)
GLUCOSE: 111 mg/dL — AB (ref 70–99)
Potassium: 4.1 mmol/L (ref 3.5–5.1)
Sodium: 141 mmol/L (ref 135–145)
Total Bilirubin: 0.6 mg/dL (ref 0.3–1.2)
Total Protein: 6.1 g/dL — ABNORMAL LOW (ref 6.5–8.1)

## 2017-08-12 LAB — URINALYSIS, ROUTINE W REFLEX MICROSCOPIC
BILIRUBIN URINE: NEGATIVE
Glucose, UA: NEGATIVE mg/dL
Hgb urine dipstick: NEGATIVE
Ketones, ur: NEGATIVE mg/dL
Leukocytes, UA: NEGATIVE
Nitrite: NEGATIVE
PH: 7 (ref 5.0–8.0)
Protein, ur: NEGATIVE mg/dL
SPECIFIC GRAVITY, URINE: 1.002 — AB (ref 1.005–1.030)

## 2017-08-12 LAB — CBG MONITORING, ED
Glucose-Capillary: 118 mg/dL — ABNORMAL HIGH (ref 70–99)
Glucose-Capillary: 115 mg/dL — ABNORMAL HIGH (ref 70–99)

## 2017-08-12 LAB — I-STAT TROPONIN, ED: Troponin i, poc: 0 ng/mL (ref 0.00–0.08)

## 2017-08-12 LAB — AMMONIA: Ammonia: 37 umol/L — ABNORMAL HIGH (ref 9–35)

## 2017-08-12 MED ORDER — ALUM & MAG HYDROXIDE-SIMETH 200-200-20 MG/5ML PO SUSP: 30.0000 mL | Freq: Four times a day (QID) | ORAL | Status: DC | PRN

## 2017-08-12 MED ORDER — ZOLPIDEM TARTRATE 5 MG PO TABS
5.0000 mg | ORAL_TABLET | Freq: Every evening | ORAL | Status: DC | PRN
  Administered 2017-08-12: 5 mg via ORAL
  Filled 2017-08-12: qty 1

## 2017-08-12 MED ORDER — ACETAMINOPHEN 325 MG PO TABS: 650.0000 mg | ORAL_TABLET | ORAL | Status: DC | PRN

## 2017-08-12 MED ORDER — NICOTINE 21 MG/24HR TD PT24: 21.0000 mg | MEDICATED_PATCH | Freq: Every day | TRANSDERMAL | Status: DC

## 2017-08-12 MED ORDER — ONDANSETRON HCL 4 MG PO TABS: 4.0000 mg | ORAL_TABLET | Freq: Three times a day (TID) | ORAL | Status: DC | PRN

## 2017-08-12 NOTE — ED Notes (Signed)
DINNER order placed. 

## 2017-08-12 NOTE — ED Notes (Signed)
CBG was 118. Notified Tray, RN.
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
## 2017-08-12 NOTE — ED Notes (Signed)
Pt. Given a sandwich bag and drink.
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
## 2017-08-13 ENCOUNTER — Encounter (HOSPITAL_COMMUNITY): Payer: Self-pay | Admitting: Emergency Medicine

## 2017-08-13 DIAGNOSIS — F209 Schizophrenia, unspecified: Secondary | ICD-10-CM
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
## 2017-08-13 NOTE — ED Notes (Signed)
Pt given sandwhich per request . drinkin po fluids and tolerating well.
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
## 2017-08-13 NOTE — ED Notes (Addendum)
Pt. Found to have a pencil hiding in bed.  Pt. Pointed at his right arm and said "I hurt myself because I am suicidal and want to drink my blood." When asked how he got the pencil pt stated "I hid the pencil in my butt before I got here". Old dried blood noted to be on the sheets. When asked when pt. Hurt himself pt stated "I hurt myself yesterday and I hurt myself this evening". Wound noted to not be bleeding. Arm warm to touch and radial pulse intact. Pt. Scrubs were searched and pencil was removed from room. Fresh sheets placed on bed. Pt stable with room secure at this time. Charge notified and [place], [place] made aware.

## 2017-08-13 NOTE — ED Notes (Signed)
Pt states he understands instructions. JHome stable with steady gait using cane.

## 2017-08-13 NOTE — ED Notes (Signed)
Pt states he has no desire to hurt himself or other and is not seeing or hearing any thing not there. Alert . Oriented to person place and situation. Moves all extrmites.

## 2017-08-13 NOTE — ED Notes (Addendum)
Pt talking on phone with guardian.

## 2017-08-13 NOTE — ED Provider Notes (Signed)
BH team has called - they have re-assessed and indicate patient is psychiatrically clear for d/c to ecf/group home.   Patient is alert and content appearing. He voices no thoughts of harm to self or others. He has normal mood and affect.   Vitals:   08/12/17 1931 08/13/17 0403  BP: (!) 128/105 128/69  Pulse: 95 68  Resp: 18 20  Temp:  97.8 F (36.6 C)  SpO2: 97% 100%   Patient currently appears stable for d/c.   rec close outpt follow up with hi pcp and behavioral health provider.      Cathren LaineSteinl, Caron Tardif, MD 08/13/17 1154

## 2017-08-13 NOTE — ED Notes (Signed)
DC intructions discussed and printed copy given tio Vonna KotykJay of Group Home.

## 2017-08-13 NOTE — ED Notes (Signed)
Pt oob to bathroom with steady gait using cane.

## 2017-08-13 NOTE — ED Notes (Addendum)
Pt sitting at bedside after completely eating meal. Pt denies plan to hurt self and states he does not remember saying that or hurting self.No bleeding noted anywhere on pt.

## 2017-08-13 NOTE — Discharge Instructions (Addendum)
It was our pleasure to provide your ER care today - we hope that you feel better.  Follow up with your primary care and behavioral health providers in the coming week.  For mental health issues and/or crisis, go directly to [facility].  Return to ER if worse, new symptoms, trouble breathing, or other emergency.

## 2017-08-13 NOTE — ED Notes (Signed)
Gerald DroughtErica, Guardian contacted . States to [employer] MaterialsContact Jay at Group home. Same done and report given to [place].

## 2017-08-13 NOTE — ED Notes (Signed)
Wound at right forearm scabed awith no redness or drainage. Pt deneis any other injuries.

## 2017-09-09 ENCOUNTER — Other Ambulatory Visit: Payer: Self-pay

## 2017-09-09 ENCOUNTER — Encounter (HOSPITAL_COMMUNITY): Payer: Self-pay

## 2017-09-09 ENCOUNTER — Emergency Department (HOSPITAL_COMMUNITY)
Admission: EM | Admit: 2017-09-09 | Discharge: 2017-09-10 | Disposition: A | Discharge status: Home / Self Care | Payer: Medicare Other | Attending: Emergency Medicine | Admitting: Emergency Medicine

## 2017-09-09 DIAGNOSIS — F209 Schizophrenia, unspecified: Secondary | ICD-10-CM | POA: Insufficient documentation

## 2017-09-09 DIAGNOSIS — F79 Unspecified intellectual disabilities: Secondary | ICD-10-CM | POA: Insufficient documentation

## 2017-09-09 DIAGNOSIS — E119 Type 2 diabetes mellitus without complications: Secondary | ICD-10-CM | POA: Diagnosis not present

## 2017-09-09 DIAGNOSIS — R45851 Suicidal ideations: Secondary | ICD-10-CM | POA: Insufficient documentation

## 2017-09-09 DIAGNOSIS — Z79899 Other long term (current) drug therapy: Secondary | ICD-10-CM | POA: Diagnosis not present

## 2017-09-09 DIAGNOSIS — G20 Parkinson's disease: Secondary | ICD-10-CM | POA: Diagnosis not present

## 2017-09-09 DIAGNOSIS — I10 Essential (primary) hypertension: Secondary | ICD-10-CM | POA: Insufficient documentation

## 2017-09-09 DIAGNOSIS — R45850 Homicidal ideations: Secondary | ICD-10-CM | POA: Insufficient documentation

## 2017-09-09 DIAGNOSIS — F259 Schizoaffective disorder, unspecified: Secondary | ICD-10-CM | POA: Diagnosis present

## 2017-09-09 DIAGNOSIS — J449 Chronic obstructive pulmonary disease, unspecified: Secondary | ICD-10-CM | POA: Diagnosis not present

## 2017-09-09 DIAGNOSIS — Z794 Long term (current) use of insulin: Secondary | ICD-10-CM | POA: Diagnosis not present

## 2017-09-09 LAB — COMPREHENSIVE METABOLIC PANEL
ALBUMIN: 4.2 g/dL (ref 3.5–5.0)
ALT: 22 U/L (ref 0–44)
AST: 25 U/L (ref 15–41)
Alkaline Phosphatase: 39 U/L (ref 38–126)
Anion gap: 8 (ref 5–15)
BILIRUBIN TOTAL: 0.6 mg/dL (ref 0.3–1.2)
BUN: 10 mg/dL (ref 8–23)
CO2: 25 mmol/L (ref 22–32)
Calcium: 9.7 mg/dL (ref 8.9–10.3)
Chloride: 105 mmol/L (ref 98–111)
Creatinine, Ser: 0.77 mg/dL (ref 0.61–1.24)
GFR calc Af Amer: >60 mL/min (ref >60)
GLUCOSE: 99 mg/dL (ref 70–99)
POTASSIUM: 4.5 mmol/L (ref 3.5–5.1)
Sodium: 138 mmol/L (ref 135–145)
TOTAL PROTEIN: 7.2 g/dL (ref 6.5–8.1)

## 2017-09-09 LAB — RAPID URINE DRUG SCREEN, HOSP PERFORMED
Amphetamines: NOT DETECTED
BENZODIAZEPINES: NOT DETECTED
Barbiturates: NOT DETECTED
COCAINE: NOT DETECTED
Tetrahydrocannabinol: NOT DETECTED

## 2017-09-09 LAB — CBC
HCT: 37.1 % — ABNORMAL LOW (ref 39.0–52.0)
Hemoglobin: 12.7 g/dL — ABNORMAL LOW (ref 13.0–17.0)
MCH: 32.5 pg (ref 26.0–34.0)
MCHC: 34.2 g/dL (ref 30.0–36.0)
MCV: 94.9 fL (ref 78.0–100.0)
PLATELETS: 265 10*3/uL (ref 150–400)
RBC: 3.91 MIL/uL — ABNORMAL LOW (ref 4.22–5.81)
RDW: 12.6 % (ref 11.5–15.5)
WBC: 6.1 10*3/uL (ref 4.0–10.5)

## 2017-09-09 LAB — SALICYLATE LEVEL: Salicylate Lvl: <7 mg/dL (ref 2.8–30.0)

## 2017-09-09 LAB — ETHANOL

## 2017-09-09 LAB — ACETAMINOPHEN LEVEL: Acetaminophen (Tylenol), Serum: <10 ug/mL — ABNORMAL LOW (ref 10–30)

## 2017-09-09 MED ORDER — METFORMIN HCL 500 MG PO TABS
1000.0000 mg | ORAL_TABLET | Freq: Two times a day (BID) | ORAL | Status: DC
  Administered 2017-09-10 (×2): 1000 mg via ORAL
  Filled 2017-09-09 (×2): qty 2

## 2017-09-09 MED ORDER — INSULIN ASPART 100 UNIT/ML ~~LOC~~ SOLN
14.0000 [IU] | Freq: Three times a day (TID) | SUBCUTANEOUS | Status: DC
  Administered 2017-09-10: 14 [IU] via SUBCUTANEOUS
  Filled 2017-09-09: qty 1

## 2017-09-09 MED ORDER — TRAZODONE HCL 100 MG PO TABS
100.0000 mg | ORAL_TABLET | Freq: Three times a day (TID) | ORAL | Status: DC
  Administered 2017-09-10 (×2): 100 mg via ORAL
  Filled 2017-09-09 (×2): qty 1

## 2017-09-09 MED ORDER — METOPROLOL SUCCINATE ER 25 MG PO TB24
25.0000 mg | ORAL_TABLET | Freq: Every morning | ORAL | Status: DC
  Administered 2017-09-10: 25 mg via ORAL
  Filled 2017-09-09: qty 1

## 2017-09-09 MED ORDER — MELATONIN 3 MG PO TABS
3.0000 mg | ORAL_TABLET | Freq: Every day | ORAL | Status: DC
  Administered 2017-09-10: 3 mg via ORAL
  Filled 2017-09-09: qty 1

## 2017-09-09 MED ORDER — GABAPENTIN 300 MG PO CAPS
300.0000 mg | ORAL_CAPSULE | Freq: Every day | ORAL | Status: DC
  Administered 2017-09-10: 300 mg via ORAL
  Filled 2017-09-09: qty 1

## 2017-09-09 MED ORDER — MIRTAZAPINE 30 MG PO TABS
15.0000 mg | ORAL_TABLET | Freq: Every day | ORAL | Status: DC
  Administered 2017-09-10: 15 mg via ORAL
  Filled 2017-09-09: qty 1

## 2017-09-09 MED ORDER — FLUTICASONE FUROATE-VILANTEROL 100-25 MCG/INH IN AEPB
1.0000 | INHALATION_SPRAY | Freq: Every day | RESPIRATORY_TRACT | Status: DC
  Filled 2017-09-09: qty 28

## 2017-09-09 MED ORDER — FENOFIBRATE 160 MG PO TABS
160.0000 mg | ORAL_TABLET | Freq: Every day | ORAL | Status: DC
  Administered 2017-09-10: 160 mg via ORAL
  Filled 2017-09-09: qty 1

## 2017-09-09 MED ORDER — TIOTROPIUM BROMIDE MONOHYDRATE 18 MCG IN CAPS
18.0000 ug | ORAL_CAPSULE | Freq: Every day | RESPIRATORY_TRACT | Status: DC
  Filled 2017-09-09: qty 5

## 2017-09-09 MED ORDER — INSULIN DETEMIR 100 UNIT/ML ~~LOC~~ SOLN
65.0000 [IU] | Freq: Every day | SUBCUTANEOUS | Status: DC
  Filled 2017-09-09 (×2): qty 0.65

## 2017-09-09 MED ORDER — SIMVASTATIN 5 MG PO TABS
5.0000 mg | ORAL_TABLET | Freq: Every day | ORAL | Status: DC
  Filled 2017-09-09: qty 1

## 2017-09-09 MED ORDER — FAMOTIDINE 20 MG PO TABS
40.0000 mg | ORAL_TABLET | Freq: Two times a day (BID) | ORAL | Status: DC
  Administered 2017-09-10 (×2): 40 mg via ORAL
  Filled 2017-09-09 (×2): qty 2

## 2017-09-09 MED ORDER — LISINOPRIL 5 MG PO TABS
5.0000 mg | ORAL_TABLET | Freq: Every morning | ORAL | Status: DC
  Administered 2017-09-10: 5 mg via ORAL
  Filled 2017-09-09: qty 1
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
## 2017-09-10 DIAGNOSIS — R45850 Homicidal ideations: Secondary | ICD-10-CM | POA: Diagnosis not present

## 2017-09-10 LAB — CBG MONITORING, ED
Glucose-Capillary: 105 mg/dL — ABNORMAL HIGH (ref 70–99)
Glucose-Capillary: 105 mg/dL — ABNORMAL HIGH (ref 70–99)
Glucose-Capillary: 138 mg/dL — ABNORMAL HIGH (ref 70–99)
Glucose-Capillary: 76 mg/dL (ref 70–99)

## 2017-09-10 NOTE — ED Notes (Signed)
CBG 138 after eating 2 sandwiches and peanut butter and crackers. Called Dr. Adela LankFloyd. He stated to hold the levemir and go ahead and give the metformin.

## 2017-09-10 NOTE — ED Notes (Addendum)
Gerald Cruz, Child psychotherapistocial worker reported that the facility will provide transport once discharged, expected to be here at 1 pm today.

## 2017-09-10 NOTE — BH Assessment (Signed)
[facility]  Per Juanetta BeetsJacqueline Norman, DO, this pt does not require psychiatric hospitalization at this time.  [facility] treatment with his current outpatient provider.  This has been included in pt's discharge instructions.  Gerald BalboaMackenzie Irwin, LCSW, agrees to facilitate pt's return to his residential facility.  Pt's nurse has been notified.  Gerald Canninghomas Philmore Lepore, MA Triage Specialist [phone]
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
## 2017-09-10 NOTE — Discharge Instructions (Signed)
For your behavioral health needs, you are advised to continue treatment with your current outpatient provider. °
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
## 2022-07-10 ENCOUNTER — Emergency Department (HOSPITAL_COMMUNITY)
Admission: EM | Admit: 2022-07-10 | Discharge: 2022-07-10 | Disposition: A | Discharge status: Home / Self Care | Payer: Medicare Other | Attending: Emergency Medicine | Admitting: Emergency Medicine

## 2022-07-10 ENCOUNTER — Emergency Department (HOSPITAL_COMMUNITY): Payer: Medicare Other

## 2022-07-10 ENCOUNTER — Encounter (HOSPITAL_COMMUNITY): Payer: Self-pay

## 2022-07-10 ENCOUNTER — Other Ambulatory Visit: Payer: Self-pay

## 2022-07-10 DIAGNOSIS — Z7951 Long term (current) use of inhaled steroids: Secondary | ICD-10-CM | POA: Insufficient documentation

## 2022-07-10 DIAGNOSIS — Z794 Long term (current) use of insulin: Secondary | ICD-10-CM | POA: Insufficient documentation

## 2022-07-10 DIAGNOSIS — R0682 Tachypnea, not elsewhere classified: Secondary | ICD-10-CM | POA: Diagnosis not present

## 2022-07-10 DIAGNOSIS — Z1152 Encounter for screening for COVID-19: Secondary | ICD-10-CM | POA: Diagnosis not present

## 2022-07-10 DIAGNOSIS — J441 Chronic obstructive pulmonary disease with (acute) exacerbation: Secondary | ICD-10-CM | POA: Diagnosis not present

## 2022-07-10 DIAGNOSIS — Z7982 Long term (current) use of aspirin: Secondary | ICD-10-CM | POA: Insufficient documentation

## 2022-07-10 DIAGNOSIS — R0602 Shortness of breath: Secondary | ICD-10-CM | POA: Diagnosis present

## 2022-07-10 LAB — I-STAT CHEM 8, ED
BUN: 9 mg/dL (ref 8–23)
Calcium, Ion: 1.12 mmol/L — ABNORMAL LOW (ref 1.15–1.40)
Chloride: 93 mmol/L — ABNORMAL LOW (ref 98–111)
Creatinine, Ser: 0.6 mg/dL — ABNORMAL LOW (ref 0.61–1.24)
Glucose, Bld: 311 mg/dL — ABNORMAL HIGH (ref 70–99)
HCT: 35 % — ABNORMAL LOW (ref 39.0–52.0)
Hemoglobin: 11.9 g/dL — ABNORMAL LOW (ref 13.0–17.0)
Potassium: 4.2 mmol/L (ref 3.5–5.1)
Sodium: 136 mmol/L (ref 135–145)
TCO2: 38 mmol/L — ABNORMAL HIGH (ref 22–32)

## 2022-07-10 LAB — I-STAT VENOUS BLOOD GAS, ED
Acid-Base Excess: 11 mmol/L — ABNORMAL HIGH (ref 0.0–2.0)
Bicarbonate: 38.4 mmol/L — ABNORMAL HIGH (ref 20.0–28.0)
Calcium, Ion: 1.1 mmol/L — ABNORMAL LOW (ref 1.15–1.40)
HCT: 35 % — ABNORMAL LOW (ref 39.0–52.0)
Hemoglobin: 11.9 g/dL — ABNORMAL LOW (ref 13.0–17.0)
O2 Saturation: 99 %
Potassium: 4.2 mmol/L (ref 3.5–5.1)
Sodium: 136 mmol/L (ref 135–145)
TCO2: 40 mmol/L — ABNORMAL HIGH (ref 22–32)
pCO2, Ven: 67.7 mmHg — ABNORMAL HIGH (ref 44–60)
pH, Ven: 7.362 (ref 7.25–7.43)
pO2, Ven: 142 mmHg — ABNORMAL HIGH (ref 32–45)

## 2022-07-10 LAB — CBC WITH DIFFERENTIAL/PLATELET
Abs Immature Granulocytes: 0.06 10*3/uL (ref 0.00–0.07)
Basophils Absolute: 0 10*3/uL (ref 0.0–0.1)
Basophils Relative: 0 %
Eosinophils Absolute: 0.1 10*3/uL (ref 0.0–0.5)
Eosinophils Relative: 2 %
HCT: 36.7 % — ABNORMAL LOW (ref 39.0–52.0)
Hemoglobin: 10.5 g/dL — ABNORMAL LOW (ref 13.0–17.0)
Immature Granulocytes: 1 %
Lymphocytes Relative: 11 %
Lymphs Abs: 0.8 10*3/uL (ref 0.7–4.0)
MCH: 26.5 pg (ref 26.0–34.0)
MCHC: 28.6 g/dL — ABNORMAL LOW (ref 30.0–36.0)
MCV: 92.7 fL (ref 80.0–100.0)
Monocytes Absolute: 0.6 10*3/uL (ref 0.1–1.0)
Monocytes Relative: 9 %
Neutro Abs: 5.5 10*3/uL (ref 1.7–7.7)
Neutrophils Relative %: 77 %
Platelets: 202 10*3/uL (ref 150–400)
RBC: 3.96 MIL/uL — ABNORMAL LOW (ref 4.22–5.81)
RDW: 14.6 % (ref 11.5–15.5)
WBC: 7.1 10*3/uL (ref 4.0–10.5)
nRBC: 0 % (ref 0.0–0.2)

## 2022-07-10 LAB — COMPREHENSIVE METABOLIC PANEL
ALT: 12 U/L (ref 0–44)
AST: 13 U/L — ABNORMAL LOW (ref 15–41)
Albumin: 3 g/dL — ABNORMAL LOW (ref 3.5–5.0)
Alkaline Phosphatase: 99 U/L (ref 38–126)
Anion gap: 7 (ref 5–15)
BUN: 7 mg/dL — ABNORMAL LOW (ref 8–23)
CO2: 37 mmol/L — ABNORMAL HIGH (ref 22–32)
Calcium: 8.6 mg/dL — ABNORMAL LOW (ref 8.9–10.3)
Chloride: 93 mmol/L — ABNORMAL LOW (ref 98–111)
Creatinine, Ser: 0.65 mg/dL (ref 0.61–1.24)
GFR, Estimated: >60 mL/min (ref >60)
Glucose, Bld: 292 mg/dL — ABNORMAL HIGH (ref 70–99)
Potassium: 4.3 mmol/L (ref 3.5–5.1)
Sodium: 137 mmol/L (ref 135–145)
Total Bilirubin: 0.5 mg/dL (ref 0.3–1.2)
Total Protein: 6.1 g/dL — ABNORMAL LOW (ref 6.5–8.1)

## 2022-07-10 LAB — RESP PANEL BY RT-PCR (RSV, FLU A&B, COVID)  RVPGX2
Influenza A by PCR: NEGATIVE
Influenza B by PCR: NEGATIVE
Resp Syncytial Virus by PCR: NEGATIVE
SARS Coronavirus 2 by RT PCR: NEGATIVE

## 2022-07-10 LAB — BRAIN NATRIURETIC PEPTIDE: B Natriuretic Peptide: 10 pg/mL (ref 0.0–100.0)

## 2022-07-10 MED ORDER — ALBUTEROL SULFATE (2.5 MG/3ML) 0.083% IN NEBU: 10.0000 mg | INHALATION_SOLUTION | Freq: Once | RESPIRATORY_TRACT | Status: DC

## 2022-07-10 MED ORDER — METHYLPREDNISOLONE SODIUM SUCC 125 MG IJ SOLR: 125.0000 mg | Freq: Once | INTRAMUSCULAR | Status: DC

## 2022-07-10 MED ORDER — HALOPERIDOL LACTATE 5 MG/ML IJ SOLN: 5.0000 mg | Freq: Once | INTRAMUSCULAR | Status: DC

## 2022-07-10 MED ORDER — PREDNISONE 20 MG PO TABS: 40.0000 mg | ORAL_TABLET | Freq: Every day | ORAL | 0 refills | Status: DC

## 2022-07-10 MED ORDER — DEXAMETHASONE SODIUM PHOSPHATE 10 MG/ML IJ SOLN
10.0000 mg | Freq: Once | INTRAMUSCULAR | Status: AC
  Administered 2022-07-10: 10 mg via INTRAMUSCULAR
  Filled 2022-07-10: qty 1

## 2022-07-10 NOTE — ED Notes (Signed)
EDP at bedside. Patient stating he does not want to be here and wants to go home.

## 2022-07-10 NOTE — ED Notes (Signed)
[facility] called & on the way- have pt & all docs/paperwork ready

## 2022-07-10 NOTE — Discharge Instructions (Addendum)
Please take all medication as directed and follow-up with your physician via telephone tomorrow.  Return here for concerning changes in your condition.

## 2022-07-10 NOTE — ED Notes (Signed)
Patient rushed to room from X-ray, oxygen sats in the 70's patient would not leave oxygen on and patient was reported to be unresponsive for a brief period also.

## 2022-07-10 NOTE — ED Notes (Signed)
[facility] called for the 3rd time with no answer. Patient will be transported back to facility shortly.

## 2022-07-10 NOTE — ED Provider Notes (Signed)
[place] EMERGENCY DEPARTMENT AT [facility] Provider Note   CSN: [id number] Arrival date & time: 07/10/22  1816     History  Chief Complaint  Patient presents with   Shortness of Breath    Gerald Cruz is a 72 y.o. male.  HPI Patient presents with dyspnea.  Patient has multiple medical problems including COPD, wears oxygen 24/7, has occasional chest tightness.  Today he presents from his nursing facility after developing more chest pain. He notes that he has not been able to lie flat for some time.  He denies new fever.  Denies other complaints.  He is wearing oxygen on arrival.  Per EMS the patient was mildly tachycardic, required 4 L oxygen supplementation to achieve 93% saturation.  Facility said the patient has DNR status.    Home Medications Prior to Admission medications   Medication Sig Start Date End Date Taking? Authorizing Provider  lisinopril (PRINIVIL,ZESTRIL) 5 MG tablet Take 1 tablet (5 mg total) by mouth every morning. 01/29/13  Yes Mancel Bale, MD  predniSONE (DELTASONE) 20 MG tablet Take 2 tablets (40 mg total) by mouth daily with breakfast. For the next four days 07/10/22  Yes Gerhard Munch, MD  acetaminophen (TYLENOL) 325 MG tablet Take 2 tablets (650 mg total) by mouth every 8 (eight) hours as needed (As needed for pain). 01/29/13   Mancel Bale, MD  aspirin 81 MG chewable tablet Chew 1 tablet (81 mg total) by mouth daily. 01/29/13   Mancel Bale, MD  aspirin 81 MG chewable tablet Chew 1 tablet (81 mg total) by mouth daily. 01/29/13   Mancel Bale, MD  aspirin EC 81 MG tablet Take 81 mg by mouth daily.    [provider]  budesonide-formoterol (SYMBICORT) 80-4.5 MCG/ACT inhaler Inhale 2 puffs into the lungs 2 (two) times daily. 01/29/13   Mancel Bale, MD  budesonide-formoterol [facility]) 80-4.5 MCG/ACT inhaler Inhale 2 puffs into the lungs 2 (two) times daily.    [provider]  cetirizine (ZYRTEC) 10 MG tablet Take 10 mg  by mouth every morning.    [provider]  cyanocobalamin (,VITAMIN B-12,) 1000 MCG/ML injection Inject 1 mL (1,000 mcg total) into the muscle every 30 (thirty) days. 01/29/13   Mancel Bale, MD  docusate sodium 100 MG CAPS Take 100 mg by mouth 2 (two) times daily as needed for mild constipation. 01/29/13   Mancel Bale, MD  Dulaglutide (TRULICITY) 0.75 MG/0.5ML SOPN Inject 1 each into the skin every 7 (seven) days.    [provider]  Dulaglutide (TRULICITY) 0.75 MG/0.5ML SOPN Inject into the skin.    [provider]  famotidine (PEPCID) 20 MG tablet Take 40 mg by mouth 2 (two) times daily.    [provider]  famotidine (PEPCID) 20 MG tablet Take 40 mg by mouth daily.    [provider]  famotidine (PEPCID) 40 MG tablet Take 1 tablet (40 mg total) by mouth 2 (two) times daily. 01/29/13   Mancel Bale, MD  fenofibrate 160 MG tablet Take 1 tablet (160 mg total) by mouth at bedtime. 01/29/13   Mancel Bale, MD  fenofibrate 160 MG tablet Take 160 mg by mouth daily.    [provider]  gabapentin (NEURONTIN) 300 MG capsule Take 1 capsule (300 mg total) by mouth at bedtime. 01/29/13   Mancel Bale, MD  gabapentin (NEURONTIN) 300 MG capsule Take 300 mg by mouth at bedtime.    [provider]  ibuprofen (ADVIL,MOTRIN) 200 MG tablet  Take 2 tablets (400 mg total) by mouth every 8 (eight) hours as needed for mild pain or moderate pain. 01/29/13   Mancel Bale, MD  insulin aspart (NOVOLOG) 100 UNIT/ML injection Inject 14 Units into the skin 3 (three) times daily before meals. 01/29/13   Mancel Bale, MD  insulin detemir (LEVEMIR) 100 UNIT/ML injection Inject 0.65 mLs (65 Units total) into the skin at bedtime. Patient taking differently: Inject 100 Units into the skin at bedtime. 01/29/13   Mancel Bale, MD  insulin detemir (LEVEMIR) 100 UNIT/ML injection Inject 40 Units into the skin daily.    [provider]  ipratropium-albuterol  (DUONEB) 0.5-2.5 (3) MG/3ML SOLN Take 3 mLs by nebulization every 4 (four) hours. 01/29/13   Mancel Bale, MD  lisinopril (PRINIVIL,ZESTRIL) 5 MG tablet Take 5 mg by mouth every morning.    [provider]  lisinopril (PRINIVIL,ZESTRIL) 5 MG tablet Take 5 mg by mouth daily.    [provider]  loperamide (IMODIUM A-D) 2 MG capsule Take 2 mg by mouth 2 (two) times daily as needed for diarrhea or loose stools.    [provider]  loperamide (IMODIUM) 2 MG capsule Take 2 mg by mouth 2 (two) times daily as needed for diarrhea or loose stools.    [provider]  loratadine (CLARITIN) 10 MG tablet Take 1 tablet (10 mg total) by mouth daily as needed for allergies, rhinitis or itching. 01/29/13   Mancel Bale, MD  LORazepam (ATIVAN) 1 MG tablet Take 1 tablet (1 mg total) by mouth every 6 (six) hours as needed for anxiety or sedation. 01/29/13   Mancel Bale, MD  Melatonin 3 MG CAPS Take 3 mg by mouth daily.    [provider]  Melatonin 3 MG TABS Take 3 mg by mouth at bedtime.    [provider]  metFORMIN (GLUCOPHAGE) 1000 MG tablet Take 1 tablet (1,000 mg total) by mouth 2 (two) times daily. 01/29/13   Mancel Bale, MD  metFORMIN (GLUCOPHAGE) 1000 MG tablet Take 1,000 mg by mouth 2 (two) times daily with a meal.    [provider]  metoprolol succinate (TOPROL-XL) 25 MG 24 hr tablet Take 25 mg by mouth every morning.    [provider]  metoprolol succinate (TOPROL-XL) 25 MG 24 hr tablet Take 1 tablet (25 mg total) by mouth every morning. Patient not taking: Reported on 10/09/2016 01/29/13   Mancel Bale, MD  metoprolol succinate (TOPROL-XL) 25 MG 24 hr tablet Take 25 mg by mouth daily.    [provider]  mirtazapine (REMERON) 15 MG tablet Take 15 mg by mouth at bedtime.    [provider]  mirtazapine (REMERON) 15 MG tablet Take 15 mg by mouth at bedtime.    [provider]  nabumetone (RELAFEN) 500 MG  tablet Take 500 mg by mouth 2 (two) times daily as needed.    [provider]  Paliperidone Palmitate (INVEGA TRINZA) 410 MG/1.315ML SUSP Inject 1 each into the muscle every 3 (three) months.    [provider]  Paliperidone Palmitate ER (INVEGA TRINZA) 410 MG/1.315ML SUSY Inject 410 mg into the muscle every 3 (three) months.    [provider]  risperiDONE (RISPERDAL M-TABS) 1 MG disintegrating tablet Take 1 tablet (1 mg total) by mouth 3 (three) times daily. 01/29/13   Mancel Bale, MD  simvastatin (ZOCOR) 5 MG tablet Take 1 tablet (5 mg total) by mouth daily at 6 PM. 01/29/13   Mancel Bale, MD  simvastatin (ZOCOR) 5 MG tablet Take 5 mg by mouth at bedtime.    [provider]  tiotropium (SPIRIVA) 18 MCG inhalation capsule Place 1 capsule (18 mcg total) into inhaler and inhale daily. 01/29/13   Mancel Bale, MD  tiotropium (SPIRIVA) 18 MCG inhalation capsule Place 18 mcg into inhaler and inhale daily.    [provider]  traMADol (ULTRAM) 50 MG tablet Take 50 mg by mouth 3 (three) times daily.    [provider]  traZODone (DESYREL) 100 MG tablet Take 100 mg by mouth 3 (three) times daily.    [provider]      Allergies    Patient has no known allergies.    Review of Systems   Review of Systems  Unable to perform ROS: Acuity of condition    Physical Exam Updated Vital Signs BP 103/86   Pulse 100   Temp 98 F (36.7 C) (Axillary)   Resp 15   Wt 90.7 kg   SpO2 97%   BMI 31.32 kg/m  Physical Exam Vitals and nursing note reviewed.  Constitutional:      General: He is not in acute distress.    Appearance: He is well-developed. He is obese. He is ill-appearing.  HENT:     Head: Normocephalic and atraumatic.  Eyes:     Conjunctiva/sclera: Conjunctivae normal.  Cardiovascular:     Rate and Rhythm: Normal rate and regular rhythm.  Pulmonary:     Effort: Tachypnea and accessory muscle usage present.  Abdominal:      General: There is no distension.  Skin:    General: Skin is warm and dry.  Neurological:     Mental Status: He is alert and oriented to person, place, and time.     Comments: Dysarthria is appreciable, though face is symmetric.  Patient does move extremities spontaneously and tries to pull things out of his backpack, no overt neuro deficits beyond the speech.     ED Results / Procedures / Treatments   Labs (all labs ordered are listed, but only abnormal results are displayed) Labs Reviewed  COMPREHENSIVE METABOLIC PANEL - Abnormal; Notable for the following components:      Result Value   Chloride 93 (*)    CO2 37 (*)    Glucose, Bld 292 (*)    BUN 7 (*)    Calcium 8.6 (*)    Total Protein 6.1 (*)    Albumin 3.0 (*)    AST 13 (*)    All other components within normal limits  CBC WITH DIFFERENTIAL/PLATELET - Abnormal; Notable for the following components:   RBC 3.96 (*)    Hemoglobin 10.5 (*)    HCT 36.7 (*)    MCHC 28.6 (*)    All other components within normal limits  I-STAT VENOUS BLOOD GAS, ED - Abnormal; Notable for the following components:   pCO2, Ven 67.7 (*)    pO2, Ven 142 (*)    Bicarbonate 38.4 (*)    TCO2 40 (*)    Acid-Base Excess 11.0 (*)    Calcium, Ion 1.10 (*)    HCT 35.0 (*)    Hemoglobin 11.9 (*)    All other components within normal limits  I-STAT CHEM 8, ED - Abnormal; Notable for the following components:   Chloride 93 (*)    Creatinine, Ser 0.60 (*)    Glucose, Bld 311 (*)    Calcium, Ion 1.12 (*)    TCO2 38 (*)    Hemoglobin  11.9 (*)    HCT 35.0 (*)    All other components within normal limits  RESP PANEL BY RT-PCR (RSV, FLU A&B, COVID)  [id number]  BRAIN NATRIURETIC PEPTIDE    EKG EKG Interpretation  Date/Time:  Thursday July 10 2022 18:47:57 EDT Ventricular Rate:  102 PR Interval:  172 QRS Duration: 114 QT Interval:  366 QTC Calculation: 477 R Axis:   -54 Text Interpretation: Sinus tachycardia Left anterior fascicular block Minimal  voltage criteria for LVH, may be normal variant ( Cornell product ) Abnormal ECGT  Confirmed by Gerhard Munch [phone]) on 07/10/2022 7:41:49 PM  Radiology DG Chest 1 View  Result Date: 07/10/2022 CLINICAL DATA:  Shortness of breath EXAM: PORTABLE CHEST 1 VIEW COMPARISON:  01/17/2013 FINDINGS: Cardiac shadow is mildly enlarged but accentuated by the frontal technique. Aortic calcifications are noted. Lungs are well aerated bilaterally. Mild basilar atelectasis is seen. No bony abnormality is noted. IMPRESSION: Mild bibasilar atelectasis. Electronically Signed   By: Alcide Clever M.D.   On: 07/10/2022 19:45    Procedures Procedures    Medications Ordered in ED Medications  albuterol (PROVENTIL) (2.5 MG/3ML) 0.083% nebulizer solution 10 mg (0 mg Nebulization Hold 07/10/22 2102)  dexamethasone (DECADRON) injection 10 mg (10 mg Intramuscular Given 07/10/22 2100)    ED Course/ Medical Decision Making/ A&P                             Medical Decision Making Patient with baseline cognitive impairment, schizoaffective disorder, COPD, oxygen use at his nursing facility presents with dyspnea, possible chest pain.  Broad differential including pneumonia, COPD exacerbation, ACS considered. Labs sent.   Amount and/or Complexity of Data Reviewed Independent Historian: EMS External Data Reviewed: notes.    Details: Prior hosp notes reviewed Labs: ordered. Decision-making details documented in ED Course. Radiology: ordered and independent interpretation performed. Decision-making details documented in ED Course. ECG/medicine tests: ordered and independent interpretation performed. Decision-making details documented in ED Course.  Risk Prescription drug management. Decision regarding hospitalization. Diagnosis or treatment significantly limited by social determinants of health.  While on CT the patient removed his oxygen.  After this was replaced saturation went back to his normal.  10:02  PM Patient has remained essentially without complaint, unremarkable for the past 90 minutes he is on his typical nasal cannula.  Labs reviewed, unremarkable no evidence for pneumonia, bacteremia, sepsis.  X-ray reviewed, no evidence for pneumonia.  Some suspicion for COPD exacerbation, but without new oxygen requirement, without distress, no evidence for decompensated state. Patient has requested discharge to his facility.  I have attempted to contact that facility via telephone, attempted to contact his legal guardian and his brother, with no response from any of the above. Given the patient's lack of need for additional supplemental oxygen, persistent appropriate saturation with his typical nasal cannula, patient will be returned to his nursing facility.        Final Clinical Impression(s) / ED Diagnoses Final diagnoses:  COPD exacerbation (HCC)    Rx / DC Orders ED Discharge Orders          Ordered    predniSONE (DELTASONE) 20 MG tablet  Daily with breakfast        07/10/22 2201              Gerhard Munch, MD 07/10/22 2202

## 2022-07-10 NOTE — ED Notes (Signed)
[facility] called for the second time. No answer

## 2022-07-10 NOTE — ED Notes (Signed)
Patient resting, no distress noted at this time

## 2022-07-10 NOTE — ED Triage Notes (Signed)
Pt BIB [facility] from [facility] c/o of SOB. Pt has hx of COPD and asthma. Facility said is a DNR but did not give form to EMS.   EMS VS BP 140/80 HR 101 O2 90% on 3 L.... 93% on 4L

## 2022-07-10 NOTE — ED Notes (Signed)
Pt is refusing to have blood work drawn. 

## 2022-07-10 NOTE — ED Notes (Signed)
Patient is restless and will not leave oxygen or monitoring equipment in place. Patients screaming help me. Monitoring equipment placed back on him and his oxygen saturations are low to mid 80's. EDP notified

## 2022-07-12 ENCOUNTER — Emergency Department (HOSPITAL_COMMUNITY): Payer: Medicare Other

## 2022-07-12 ENCOUNTER — Emergency Department (HOSPITAL_COMMUNITY)
Admission: EM | Admit: 2022-07-12 | Discharge: 2022-07-13 | Disposition: A | Discharge status: Home / Self Care | Payer: Medicare Other | Attending: Emergency Medicine | Admitting: Emergency Medicine

## 2022-07-12 ENCOUNTER — Encounter (HOSPITAL_COMMUNITY): Payer: Self-pay

## 2022-07-12 ENCOUNTER — Other Ambulatory Visit: Payer: Self-pay

## 2022-07-12 DIAGNOSIS — G20A1 Parkinson's disease without dyskinesia, without mention of fluctuations: Secondary | ICD-10-CM | POA: Diagnosis not present

## 2022-07-12 DIAGNOSIS — Z7982 Long term (current) use of aspirin: Secondary | ICD-10-CM | POA: Insufficient documentation

## 2022-07-12 DIAGNOSIS — Z7951 Long term (current) use of inhaled steroids: Secondary | ICD-10-CM | POA: Diagnosis not present

## 2022-07-12 DIAGNOSIS — J449 Chronic obstructive pulmonary disease, unspecified: Secondary | ICD-10-CM | POA: Diagnosis not present

## 2022-07-12 DIAGNOSIS — Z7952 Long term (current) use of systemic steroids: Secondary | ICD-10-CM | POA: Diagnosis not present

## 2022-07-12 DIAGNOSIS — R062 Wheezing: Secondary | ICD-10-CM | POA: Insufficient documentation

## 2022-07-12 DIAGNOSIS — Z20822 Contact with and (suspected) exposure to covid-19: Secondary | ICD-10-CM | POA: Diagnosis not present

## 2022-07-12 DIAGNOSIS — W19XXXA Unspecified fall, initial encounter: Secondary | ICD-10-CM | POA: Diagnosis not present

## 2022-07-12 DIAGNOSIS — R0602 Shortness of breath: Secondary | ICD-10-CM | POA: Diagnosis present

## 2022-07-12 LAB — COMPREHENSIVE METABOLIC PANEL
ALT: 5 U/L (ref 0–44)
AST: 14 U/L — ABNORMAL LOW (ref 15–41)
Albumin: 3.2 g/dL — ABNORMAL LOW (ref 3.5–5.0)
Alkaline Phosphatase: 80 U/L (ref 38–126)
Anion gap: 6 (ref 5–15)
BUN: 12 mg/dL (ref 8–23)
CO2: 38 mmol/L — ABNORMAL HIGH (ref 22–32)
Calcium: 8.4 mg/dL — ABNORMAL LOW (ref 8.9–10.3)
Chloride: 90 mmol/L — ABNORMAL LOW (ref 98–111)
Creatinine, Ser: 0.58 mg/dL — ABNORMAL LOW (ref 0.61–1.24)
GFR, Estimated: >60 mL/min (ref >60)
Glucose, Bld: 299 mg/dL — ABNORMAL HIGH (ref 70–99)
Potassium: 4.3 mmol/L (ref 3.5–5.1)
Sodium: 134 mmol/L — ABNORMAL LOW (ref 135–145)
Total Bilirubin: 0.4 mg/dL (ref 0.3–1.2)
Total Protein: 6.8 g/dL (ref 6.5–8.1)

## 2022-07-12 LAB — BRAIN NATRIURETIC PEPTIDE: B Natriuretic Peptide: 10.2 pg/mL (ref 0.0–100.0)

## 2022-07-12 LAB — RESP PANEL BY RT-PCR (RSV, FLU A&B, COVID)  RVPGX2
Influenza A by PCR: NEGATIVE
Influenza B by PCR: NEGATIVE
Resp Syncytial Virus by PCR: NEGATIVE
SARS Coronavirus 2 by RT PCR: NEGATIVE

## 2022-07-12 LAB — TROPONIN I (HIGH SENSITIVITY): Troponin I (High Sensitivity): 10 ng/L (ref <18)

## 2022-07-12 MED ORDER — IPRATROPIUM-ALBUTEROL 0.5-2.5 (3) MG/3ML IN SOLN
3.0000 mL | Freq: Once | RESPIRATORY_TRACT | Status: AC
  Administered 2022-07-12: 3 mL via RESPIRATORY_TRACT
  Filled 2022-07-12: qty 3

## 2022-07-12 NOTE — ED Provider Notes (Addendum)
[place] EMERGENCY DEPARTMENT AT [facility] Provider Note   CSN: [id number] Arrival date & time: 07/12/22  2014     History  Chief Complaint  Patient presents with   Shortness of Breath    Pt BIBA from SNF([facility]) w/increased SOB and possible unwitness fall per medic.    Gerald Cruz is a 72 y.o. male.  Patient brought here for ongoing shortness of breath.  History of COPD on 4 L of oxygen.  He has been on steroids.  Seen a couple days ago.  No fever or chills.  History of schizophrenia, memory issues.  History of Parkinson's disease, schizoaffective disorder, COPD.  He denies any chest pain.  Denies any pain.  No headache, extremity pain, chest pain, weakness, numbness, chills.  Question if he had a fall at his facility but there is no pain per patient.  The history is provided by the patient.       Home Medications Prior to Admission medications   Medication Sig Start Date End Date Taking? Authorizing Provider  acetaminophen (TYLENOL) 325 MG tablet Take 2 tablets (650 mg total) by mouth every 8 (eight) hours as needed (As needed for pain). Patient not taking: Reported on 07/10/2022 01/29/13   Mancel Bale, MD  ADVAIR DISKUS 250-50 MCG/ACT AEPB Inhale 1 puff into the lungs in the morning and at bedtime. 07/11/22   [provider]  albuterol (PROVENTIL) (2.5 MG/3ML) 0.083% nebulizer solution Take 2.5 mg by nebulization in the morning and at bedtime. 07/11/22   [provider]  aspirin 81 MG chewable tablet Chew 1 tablet (81 mg total) by mouth daily. Patient not taking: Reported on 07/10/2022 01/29/13   Mancel Bale, MD  aspirin 81 MG chewable tablet Chew 1 tablet (81 mg total) by mouth daily. Patient not taking: Reported on 07/10/2022 01/29/13   Mancel Bale, MD  atorvastatin (LIPITOR) 20 MG tablet Take 20 mg by mouth at bedtime. 07/10/22   [provider]  budesonide-formoterol (SYMBICORT) 80-4.5 MCG/ACT inhaler Inhale 2 puffs into the  lungs 2 (two) times daily. Patient not taking: Reported on 07/10/2022 01/29/13   Mancel Bale, MD  Carbidopa 25 MG tablet Take 25 mg by mouth See admin instructions. Take 25 mg by mouth at 9 AM, 1 PM, 5 PM, and 9 PM 07/10/22   [provider]  Cholecalciferol 125 MCG (5000 UT) TABS Take 5,000 Units by mouth in the morning. 07/11/22   [provider]  cyanocobalamin (,VITAMIN B-12,) 1000 MCG/ML injection Inject 1 mL (1,000 mcg total) into the muscle every 30 (thirty) days. Patient not taking: Reported on 07/10/2022 01/29/13   Mancel Bale, MD  docusate sodium 100 MG CAPS Take 100 mg by mouth 2 (two) times daily as needed for mild constipation. Patient not taking: Reported on 07/10/2022 01/29/13   Mancel Bale, MD  donepezil (ARICEPT) 10 MG tablet Take 10 mg by mouth at bedtime. 07/10/22   [provider]  famotidine (PEPCID) 40 MG tablet Take 1 tablet (40 mg total) by mouth 2 (two) times daily. Patient not taking: Reported on 07/10/2022 01/29/13   Mancel Bale, MD  fenofibrate 160 MG tablet Take 1 tablet (160 mg total) by mouth at bedtime. Patient not taking: Reported on 07/10/2022 01/29/13   Mancel Bale, MD  finasteride (PROSCAR) 5 MG tablet Take 5 mg by mouth in the morning. 07/11/22   [provider]  gabapentin (NEURONTIN) 300 MG capsule Take 1 capsule (300 mg total) by mouth at bedtime. Patient  not taking: Reported on 07/10/2022 01/29/13   Mancel Bale, MD  HUMALOG KWIKPEN 100 UNIT/ML KwikPen Inject 5 Units into the skin 3 (three) times daily before meals. 07/10/22   [provider]  ibuprofen (ADVIL,MOTRIN) 200 MG tablet Take 2 tablets (400 mg total) by mouth every 8 (eight) hours as needed for mild pain or moderate pain. Patient not taking: Reported on 07/10/2022 01/29/13   Mancel Bale, MD  insulin aspart (NOVOLOG) 100 UNIT/ML injection Inject 14 Units into the skin 3 (three) times daily before meals. Patient not taking: Reported on 07/10/2022 01/29/13    Mancel Bale, MD  insulin detemir (LEVEMIR) 100 UNIT/ML injection Inject 0.65 mLs (65 Units total) into the skin at bedtime. Patient not taking: Reported on 07/10/2022 01/29/13   Mancel Bale, MD  ipratropium-albuterol (DUONEB) 0.5-2.5 (3) MG/3ML SOLN Take 3 mLs by nebulization every 4 (four) hours. Patient not taking: Reported on 07/10/2022 01/29/13   Mancel Bale, MD  lamoTRIgine (LAMICTAL) 25 MG tablet Take 25 mg by mouth at bedtime. 07/10/22   [provider]  LANTUS SOLOSTAR 100 UNIT/ML Solostar Pen Inject 40 Units into the skin in the morning and at bedtime. 07/11/22   [provider]  loratadine (CLARITIN) 10 MG tablet Take 1 tablet (10 mg total) by mouth daily as needed for allergies, rhinitis or itching. Patient not taking: Reported on 07/10/2022 01/29/13   Mancel Bale, MD  LORazepam (ATIVAN) 1 MG tablet Take 1 tablet (1 mg total) by mouth every 6 (six) hours as needed for anxiety or sedation. Patient not taking: Reported on 07/10/2022 01/29/13   Mancel Bale, MD  metFORMIN (GLUCOPHAGE) 1000 MG tablet Take 1 tablet (1,000 mg total) by mouth 2 (two) times daily. Patient not taking: Reported on 07/10/2022 01/29/13   Mancel Bale, MD  metoprolol succinate (TOPROL-XL) 25 MG 24 hr tablet Take 1 tablet (25 mg total) by mouth every morning. Patient not taking: Reported on 10/09/2016 01/29/13   Mancel Bale, MD  nystatin powder Apply 1 Application topically See admin instructions. Apply to the groin area for 5 days- beginning on 07/11/2022 07/11/22   [provider]  OXYGEN Inhale 2 L/min into the lungs See admin instructions. "2  L/min every shift to maintain a saturation greater than 90%" 07/10/22   [provider]  polyethylene glycol powder (GLYCOLAX/MIRALAX) 17 GM/SCOOP powder Take 17 g by mouth at bedtime. 07/10/22   [provider]  predniSONE (DELTASONE) 20 MG tablet Take 2 tablets (40 mg total) by mouth daily with breakfast. For the next four days  07/10/22   Gerhard Munch, MD  [facility] RESPICLICK 108 [phone] Base) MCG/ACT AEPB Inhale 2 puffs into the lungs every 6 (six) hours as needed (for shortness of breath). 07/10/22   [provider]  risperiDONE (RISPERDAL M-TABS) 1 MG disintegrating tablet Take 1 tablet (1 mg total) by mouth 3 (three) times daily. Patient not taking: Reported on 07/10/2022 01/29/13   Mancel Bale, MD  SEROQUEL XR 200 MG 24 hr tablet Take 200 mg by mouth at bedtime. 07/10/22   [provider]  Sodium Chloride, Inhalant, 7 % NEBU Inhale 1 vial into the lungs in the morning and at bedtime. 07/11/22   [provider]  SPIRIVA RESPIMAT 2.5 MCG/ACT AERS Inhale 2 each into the lungs in the morning. 07/11/22   [provider]  tiotropium (SPIRIVA) 18 MCG inhalation capsule Place 1 capsule (18 mcg total) into inhaler and inhale daily. Patient not taking: Reported on 07/10/2022 01/29/13  Mancel Bale, MD      Allergies    Patient has no known allergies.    Review of Systems   Review of Systems  Physical Exam Updated Vital Signs BP (!) 154/72 (BP Location: Left Arm)   Pulse 95   Temp 98 F (36.7 C) (Oral)   Resp 18   Ht 5\' 7"  (1.702 m)   Wt 90.7 kg   SpO2 100%   BMI 31.32 kg/m  Physical Exam Vitals and nursing note reviewed.  Constitutional:      General: He is not in acute distress.    Appearance: He is well-developed. He is not ill-appearing.  HENT:     Head: Normocephalic and atraumatic.     Mouth/Throat:     Mouth: Mucous membranes are moist.  Eyes:     Conjunctiva/sclera: Conjunctivae normal.     Pupils: Pupils are equal, round, and reactive to light.  Cardiovascular:     Rate and Rhythm: Normal rate and regular rhythm.     Pulses: Normal pulses.     Heart sounds: Normal heart sounds. No murmur heard. Pulmonary:     Effort: Pulmonary effort is normal. No respiratory distress.     Breath sounds: Wheezing present.  Abdominal:     Palpations: Abdomen is soft.      Tenderness: There is no abdominal tenderness.  Musculoskeletal:        General: No swelling. Normal range of motion.     Cervical back: Normal range of motion and neck supple.     Right lower leg: No edema.     Left lower leg: No edema.  Skin:    General: Skin is warm and dry.     Capillary Refill: Capillary refill takes less than 2 seconds.  Neurological:     General: No focal deficit present.     Mental Status: He is alert.     Comments: Moves arms and legs without issues, follows commands  Psychiatric:        Mood and Affect: Mood normal.     ED Results / Procedures / Treatments   Labs (all labs ordered are listed, but only abnormal results are displayed) Labs Reviewed  COMPREHENSIVE METABOLIC PANEL - Abnormal; Notable for the following components:      Result Value   Sodium 134 (*)    Chloride 90 (*)    CO2 38 (*)    Glucose, Bld 299 (*)    Creatinine, Ser 0.58 (*)    Calcium 8.4 (*)    Albumin 3.2 (*)    AST 14 (*)    All other components within normal limits  RESP PANEL BY RT-PCR (RSV, FLU A&B, COVID)  [id number]  BRAIN NATRIURETIC PEPTIDE  CBC WITH DIFFERENTIAL/PLATELET  I-STAT CHEM 8, ED  TROPONIN I (HIGH SENSITIVITY)    EKG EKG Interpretation  Date/Time:  Saturday July 12 2022 20:38:56 EDT Ventricular Rate:  97 PR Interval:  171 QRS Duration: 114 QT Interval:  376 QTC Calculation: 478 R Axis:   -48 Text Interpretation: Sinus rhythm Left anterior fascicular block Confirmed by Virgina Norfolk ([id number]) on 07/12/2022 9:03:03 PM  Radiology CT Head Wo Contrast  Result Date: 07/12/2022 CLINICAL DATA:  Headaches and neck pain, initial encounter EXAM: CT HEAD WITHOUT CONTRAST CT CERVICAL SPINE WITHOUT CONTRAST TECHNIQUE: Multidetector CT imaging of the head and cervical spine was performed following the standard protocol without intravenous contrast. Multiplanar CT image reconstructions of the cervical spine were also generated. RADIATION DOSE REDUCTION: This exam  was  performed according to the departmental dose-optimization program which includes automated exposure control, adjustment of the mA and/or kV according to patient size and/or use of iterative reconstruction technique. COMPARISON:  11/02/2009 FINDINGS: CT HEAD FINDINGS Brain: No evidence of acute infarction, hemorrhage, hydrocephalus, extra-axial collection or mass lesion/mass effect. Atrophic changes are noted. Vascular: No hyperdense vessel or unexpected calcification. Skull: Normal. Negative for fracture or focal lesion. Sinuses/Orbits: No acute finding. Other: None. CT CERVICAL SPINE FINDINGS Alignment: Loss of normal cervical lordosis is noted. Skull base and vertebrae: 7 cervical segments are well visualized. Vertebral body height is well maintained. Facet hypertrophic changes and osteophytic changes are noted. No acute fracture or acute facet abnormality is noted. Soft tissues and spinal canal: Surrounding soft tissue structures are within normal limits. Upper chest: Visualized lung apices show emphysematous changes. Other: None IMPRESSION: CT of the head: Chronic atrophic changes without acute abnormality. CT of the cervical spine: Multilevel degenerative change without acute abnormality. Electronically Signed   By: Alcide Clever M.D.   On: 07/12/2022 22:27   CT Cervical Spine Wo Contrast  Result Date: 07/12/2022 CLINICAL DATA:  Headaches and neck pain, initial encounter EXAM: CT HEAD WITHOUT CONTRAST CT CERVICAL SPINE WITHOUT CONTRAST TECHNIQUE: Multidetector CT imaging of the head and cervical spine was performed following the standard protocol without intravenous contrast. Multiplanar CT image reconstructions of the cervical spine were also generated. RADIATION DOSE REDUCTION: This exam was performed according to the departmental dose-optimization program which includes automated exposure control, adjustment of the mA and/or kV according to patient size and/or use of iterative reconstruction technique.  COMPARISON:  11/02/2009 FINDINGS: CT HEAD FINDINGS Brain: No evidence of acute infarction, hemorrhage, hydrocephalus, extra-axial collection or mass lesion/mass effect. Atrophic changes are noted. Vascular: No hyperdense vessel or unexpected calcification. Skull: Normal. Negative for fracture or focal lesion. Sinuses/Orbits: No acute finding. Other: None. CT CERVICAL SPINE FINDINGS Alignment: Loss of normal cervical lordosis is noted. Skull base and vertebrae: 7 cervical segments are well visualized. Vertebral body height is well maintained. Facet hypertrophic changes and osteophytic changes are noted. No acute fracture or acute facet abnormality is noted. Soft tissues and spinal canal: Surrounding soft tissue structures are within normal limits. Upper chest: Visualized lung apices show emphysematous changes. Other: None IMPRESSION: CT of the head: Chronic atrophic changes without acute abnormality. CT of the cervical spine: Multilevel degenerative change without acute abnormality. Electronically Signed   By: Alcide Clever M.D.   On: 07/12/2022 22:27   DG Chest Portable 1 View  Result Date: 07/12/2022 CLINICAL DATA:  Cough shortness of breath. EXAM: PORTABLE CHEST 1 VIEW COMPARISON:  Chest radiograph dated 07/10/2022. FINDINGS: The heart is enlarged. Mild bilateral lower lung predominant interstitial and airspace opacities appear similar to prior exam. No pleural effusion or pneumothorax. Degenerative changes are seen in the spine. IMPRESSION: Mild bilateral lower lung predominant interstitial and airspace opacities appear similar to prior exam. Electronically Signed   By: Romona Curls M.D.   On: 07/12/2022 20:56    Procedures Procedures    Medications Ordered in ED Medications  ipratropium-albuterol (DUONEB) 0.5-2.5 (3) MG/3ML nebulizer solution 3 mL (3 mLs Nebulization Given 07/12/22 2126)    ED Course/ Medical Decision Making/ A&P                             Medical Decision Making Amount  and/or Complexity of Data Reviewed Labs: ordered. Radiology: ordered.  Risk Prescription drug management.  Hurshel Rauschenberger is here with shortness of breath.  Normal vitals.  No fever.  He is on his home oxygen.  No signs of respiratory distress.  Just seen 2 days ago for the same and started on prednisone.  Overall patient appears well.  Seems to be at his baseline.  Neurologically he is intact.  He has not complained of any chest pain or shortness of breath or cough.  Denies any pain in his head or extremities.  Overall suspect may be ongoing COPD exacerbation but seems like he is doing well.  Will give him a DuoNeb treatment.  Will recheck labs and chest x-ray and viral testing.  He had unremarkable labs and imaging 2 days ago.  Overall differential diagnosis likely ongoing viral/COPD process.  Will rule out pneumonia, ACS.  EKG shows sinus rhythm.  No ischemic changes.  There is a question of maybe unwitnessed fall at the facility but otherwise I do not see any signs of trauma on exam.  He is not having any extremity pain.  Will get a CT scan of his head and neck to be conservative.  Per my review and interpretation labs no significant anemia or electrolyte or kidney injury or leukocytosis.  Chest x-ray negative for pneumonia.  Head and neck CT unremarkable per radiology report.  Patient with CBC that clotted but he refused to have labs redrawn.  He had unremarkable CBC 2 days ago.  I have no concern that he has any significant bleeding going on.  His vitals are normal.  He seems to be at his baseline.  I do not think there is any traumatic process going on as well.  Patient safe for discharge to home.  This chart was dictated using voice recognition software.  Despite best efforts to proofread,  errors can occur which can change the documentation meaning.         Final Clinical Impression(s) / ED Diagnoses Final diagnoses:  SOB (shortness of breath)    Rx / DC Orders ED Discharge  Orders     None         Virgina Norfolk, DO 07/12/22 2330    Virgina Norfolk, DO 07/12/22 2339

## 2022-07-12 NOTE — ED Notes (Signed)
Pt refused any further lab sticks, unable to draw I-stat chem 8.Curatolo Adam,MD made aware.

## 2022-07-12 NOTE — ED Notes (Signed)
Pt refused lab draw- MD made aware.

## 2022-07-12 NOTE — Discharge Instructions (Addendum)
Workup today is unremarkable.  Continue steroids.

## 2022-07-13 DIAGNOSIS — R0602 Shortness of breath: Secondary | ICD-10-CM | POA: Diagnosis not present

## 2022-07-13 NOTE — ED Notes (Signed)
[facility] transportation set up for Pt to return back to [facility] for Nursing [facility] Rehab.

## 2022-07-21 ENCOUNTER — Emergency Department (HOSPITAL_COMMUNITY)

## 2022-07-21 ENCOUNTER — Other Ambulatory Visit: Payer: Self-pay

## 2022-07-21 ENCOUNTER — Encounter (HOSPITAL_COMMUNITY): Payer: Self-pay | Admitting: Internal Medicine

## 2022-07-21 ENCOUNTER — Inpatient Hospital Stay (HOSPITAL_COMMUNITY)
Admission: EM | Admit: 2022-07-21 | Discharge: 2022-07-22 | DRG: 190 | Disposition: A | Discharge status: Skilled Nursing Facility | Attending: Internal Medicine | Admitting: Internal Medicine

## 2022-07-21 DIAGNOSIS — Z1152 Encounter for screening for COVID-19: Secondary | ICD-10-CM | POA: Diagnosis not present

## 2022-07-21 DIAGNOSIS — Z7189 Other specified counseling: Secondary | ICD-10-CM | POA: Diagnosis not present

## 2022-07-21 DIAGNOSIS — Z7952 Long term (current) use of systemic steroids: Secondary | ICD-10-CM

## 2022-07-21 DIAGNOSIS — E7800 Pure hypercholesterolemia, unspecified: Secondary | ICD-10-CM | POA: Diagnosis present

## 2022-07-21 DIAGNOSIS — G9341 Metabolic encephalopathy: Secondary | ICD-10-CM | POA: Diagnosis present

## 2022-07-21 DIAGNOSIS — F259 Schizoaffective disorder, unspecified: Secondary | ICD-10-CM | POA: Diagnosis not present

## 2022-07-21 DIAGNOSIS — Z66 Do not resuscitate: Secondary | ICD-10-CM | POA: Diagnosis present

## 2022-07-21 DIAGNOSIS — J9601 Acute respiratory failure with hypoxia: Secondary | ICD-10-CM | POA: Diagnosis present

## 2022-07-21 DIAGNOSIS — E538 Deficiency of other specified B group vitamins: Secondary | ICD-10-CM | POA: Diagnosis present

## 2022-07-21 DIAGNOSIS — I4460 Unspecified fascicular block: Secondary | ICD-10-CM | POA: Diagnosis present

## 2022-07-21 DIAGNOSIS — Z515 Encounter for palliative care: Secondary | ICD-10-CM

## 2022-07-21 DIAGNOSIS — F39 Unspecified mood [affective] disorder: Secondary | ICD-10-CM | POA: Diagnosis present

## 2022-07-21 DIAGNOSIS — G3184 Mild cognitive impairment, so stated: Secondary | ICD-10-CM | POA: Diagnosis present

## 2022-07-21 DIAGNOSIS — E1169 Type 2 diabetes mellitus with other specified complication: Secondary | ICD-10-CM | POA: Diagnosis not present

## 2022-07-21 DIAGNOSIS — J441 Chronic obstructive pulmonary disease with (acute) exacerbation: Principal | ICD-10-CM | POA: Diagnosis present

## 2022-07-21 DIAGNOSIS — K219 Gastro-esophageal reflux disease without esophagitis: Secondary | ICD-10-CM | POA: Diagnosis present

## 2022-07-21 DIAGNOSIS — Z7951 Long term (current) use of inhaled steroids: Secondary | ICD-10-CM | POA: Diagnosis not present

## 2022-07-21 DIAGNOSIS — Z794 Long term (current) use of insulin: Secondary | ICD-10-CM | POA: Diagnosis not present

## 2022-07-21 DIAGNOSIS — I119 Hypertensive heart disease without heart failure: Secondary | ICD-10-CM | POA: Diagnosis present

## 2022-07-21 DIAGNOSIS — F70 Mild intellectual disabilities: Secondary | ICD-10-CM | POA: Diagnosis present

## 2022-07-21 DIAGNOSIS — F17210 Nicotine dependence, cigarettes, uncomplicated: Secondary | ICD-10-CM | POA: Diagnosis present

## 2022-07-21 DIAGNOSIS — G20A1 Parkinson's disease without dyskinesia, without mention of fluctuations: Secondary | ICD-10-CM | POA: Diagnosis present

## 2022-07-21 DIAGNOSIS — I10 Essential (primary) hypertension: Secondary | ICD-10-CM | POA: Diagnosis not present

## 2022-07-21 DIAGNOSIS — J9621 Acute and chronic respiratory failure with hypoxia: Secondary | ICD-10-CM | POA: Diagnosis present

## 2022-07-21 DIAGNOSIS — E1142 Type 2 diabetes mellitus with diabetic polyneuropathy: Secondary | ICD-10-CM | POA: Diagnosis present

## 2022-07-21 DIAGNOSIS — J9811 Atelectasis: Secondary | ICD-10-CM | POA: Diagnosis present

## 2022-07-21 DIAGNOSIS — E119 Type 2 diabetes mellitus without complications: Secondary | ICD-10-CM

## 2022-07-21 DIAGNOSIS — J9622 Acute and chronic respiratory failure with hypercapnia: Secondary | ICD-10-CM | POA: Diagnosis present

## 2022-07-21 DIAGNOSIS — R0603 Acute respiratory distress: Principal | ICD-10-CM

## 2022-07-21 LAB — COMPREHENSIVE METABOLIC PANEL
ALT: 8 U/L (ref 0–44)
AST: 15 U/L (ref 15–41)
Albumin: 3.2 g/dL — ABNORMAL LOW (ref 3.5–5.0)
Alkaline Phosphatase: 88 U/L (ref 38–126)
Anion gap: 10 (ref 5–15)
BUN: 13 mg/dL (ref 8–23)
CO2: 42 mmol/L — ABNORMAL HIGH (ref 22–32)
Calcium: 8.9 mg/dL (ref 8.9–10.3)
Chloride: 88 mmol/L — ABNORMAL LOW (ref 98–111)
Creatinine, Ser: 0.5 mg/dL — ABNORMAL LOW (ref 0.61–1.24)
GFR, Estimated: >60 mL/min (ref >60)
Glucose, Bld: 203 mg/dL — ABNORMAL HIGH (ref 70–99)
Potassium: 5.1 mmol/L (ref 3.5–5.1)
Sodium: 140 mmol/L (ref 135–145)
Total Bilirubin: 0.9 mg/dL (ref 0.3–1.2)
Total Protein: 6.4 g/dL — ABNORMAL LOW (ref 6.5–8.1)

## 2022-07-21 LAB — CBC WITH DIFFERENTIAL/PLATELET
Abs Immature Granulocytes: 0.05 10*3/uL (ref 0.00–0.07)
Basophils Absolute: 0 10*3/uL (ref 0.0–0.1)
Basophils Relative: 0 %
Eosinophils Absolute: 0 10*3/uL (ref 0.0–0.5)
Eosinophils Relative: 0 %
HCT: 39 % (ref 39.0–52.0)
Hemoglobin: 10.9 g/dL — ABNORMAL LOW (ref 13.0–17.0)
Immature Granulocytes: 1 %
Lymphocytes Relative: 8 %
Lymphs Abs: 0.6 10*3/uL — ABNORMAL LOW (ref 0.7–4.0)
MCH: 27.2 pg (ref 26.0–34.0)
MCHC: 27.9 g/dL — ABNORMAL LOW (ref 30.0–36.0)
MCV: 97.3 fL (ref 80.0–100.0)
Monocytes Absolute: 0.9 10*3/uL (ref 0.1–1.0)
Monocytes Relative: 12 %
Neutro Abs: 5.5 10*3/uL (ref 1.7–7.7)
Neutrophils Relative %: 79 %
Platelets: 208 10*3/uL (ref 150–400)
RBC: 4.01 MIL/uL — ABNORMAL LOW (ref 4.22–5.81)
RDW: 14.6 % (ref 11.5–15.5)
WBC: 7 10*3/uL (ref 4.0–10.5)
nRBC: 0 % (ref 0.0–0.2)

## 2022-07-21 LAB — RESP PANEL BY RT-PCR (RSV, FLU A&B, COVID)  RVPGX2
Influenza A by PCR: NEGATIVE
Influenza B by PCR: NEGATIVE
Resp Syncytial Virus by PCR: NEGATIVE
SARS Coronavirus 2 by RT PCR: NEGATIVE

## 2022-07-21 LAB — I-STAT VENOUS BLOOD GAS, ED
Acid-Base Excess: 21 mmol/L — ABNORMAL HIGH (ref 0.0–2.0)
Bicarbonate: 53.1 mmol/L — ABNORMAL HIGH (ref 20.0–28.0)
Calcium, Ion: 1.2 mmol/L (ref 1.15–1.40)
HCT: 37 % — ABNORMAL LOW (ref 39.0–52.0)
Hemoglobin: 12.6 g/dL — ABNORMAL LOW (ref 13.0–17.0)
O2 Saturation: 97 %
Potassium: 5.1 mmol/L (ref 3.5–5.1)
Sodium: 138 mmol/L (ref 135–145)
TCO2: >50 mmol/L — ABNORMAL HIGH (ref 22–32)
pCO2, Ven: 109.9 mmHg (ref 44–60)
pH, Ven: 7.292 (ref 7.25–7.43)
pO2, Ven: 107 mmHg — ABNORMAL HIGH (ref 32–45)

## 2022-07-21 LAB — I-STAT CHEM 8, ED
BUN: 20 mg/dL (ref 8–23)
Calcium, Ion: 1.21 mmol/L (ref 1.15–1.40)
Chloride: 87 mmol/L — ABNORMAL LOW (ref 98–111)
Creatinine, Ser: 0.7 mg/dL (ref 0.61–1.24)
Glucose, Bld: 204 mg/dL — ABNORMAL HIGH (ref 70–99)
HCT: 37 % — ABNORMAL LOW (ref 39.0–52.0)
Hemoglobin: 12.6 g/dL — ABNORMAL LOW (ref 13.0–17.0)
Potassium: 5.4 mmol/L — ABNORMAL HIGH (ref 3.5–5.1)
Sodium: 138 mmol/L (ref 135–145)
TCO2: 49 mmol/L — ABNORMAL HIGH (ref 22–32)

## 2022-07-21 LAB — TROPONIN I (HIGH SENSITIVITY): Troponin I (High Sensitivity): 15 ng/L (ref <18)

## 2022-07-21 MED ORDER — IPRATROPIUM-ALBUTEROL 0.5-2.5 (3) MG/3ML IN SOLN
9.0000 mL | Freq: Once | RESPIRATORY_TRACT | Status: AC
  Administered 2022-07-21: 9 mL via RESPIRATORY_TRACT
  Filled 2022-07-21: qty 9

## 2022-07-21 MED ORDER — METHYLPREDNISOLONE SODIUM SUCC 125 MG IJ SOLR
125.0000 mg | Freq: Once | INTRAMUSCULAR | Status: AC
  Administered 2022-07-21: 125 mg via INTRAVENOUS
  Filled 2022-07-21: qty 2

## 2022-07-21 NOTE — ED Provider Notes (Signed)
[place] EMERGENCY DEPARTMENT AT [facility] Provider Note   HPI: Gerald Cruz is a 72 year old male with a past medical history as below presenting today with respiratory distress.  The patient is coming from facility who reported worsening shortness of breath therefore he was sent for further evaluation in the hospital.  EMS report due to his distress they started him on BiPAP.  The patient was reportedly finding them with the BiPAP.  They reported the patient was initially lethargic and responded to Narcan.  They gave 1 mg of Narcan prior to arrival.  The patient is unable to provide additional history.  Past Medical History:  Diagnosis Date   COPD (chronic obstructive pulmonary disease) (HCC)    Diabetes mellitus    GERD (gastroesophageal reflux disease)    Hypercholesterolemia    Hypertension    Migraines    Mild mental retardation    OA (osteoarthritis)    Parkinson disease    Schizoaffective disorder     Past Surgical History:  Procedure Laterality Date   APPENDECTOMY     VEIN LIGATION AND STRIPPING       Social History   Tobacco Use   Smoking status: Every Day    Packs/day: 2.00    Years: 54.00    Additional pack years: 0.00    Total pack years: 108.00    Types: Cigarettes   Smokeless tobacco: Never  Substance Use Topics   Alcohol use: No   Drug use: No      Review of Systems  Unable to perform ROS: Dementia      Vitals:   07/21/22 2149 07/21/22 2210  BP: (!) 141/73   Pulse: 90   Resp: 20   Temp:    SpO2: 98% 98%    Physical Exam Vitals and nursing note reviewed.  Constitutional:      General: He is in acute distress.     Appearance: He is well-developed. He is ill-appearing.  HENT:     Head: Normocephalic and atraumatic.  Eyes:     Pupils: Pupils are equal, round, and reactive to light.  Cardiovascular:     Rate and Rhythm: Regular rhythm. Tachycardia present.     Pulses: Normal pulses.     Heart sounds: Normal heart  sounds. No murmur heard.    No friction rub. No gallop.  Pulmonary:     Effort: Tachypnea and respiratory distress present.     Breath sounds: Decreased breath sounds present.  Abdominal:     General: Abdomen is flat. There is no distension.     Palpations: Abdomen is soft.     Tenderness: There is no abdominal tenderness. There is no guarding or rebound.  Musculoskeletal:        General: No swelling.  Skin:    General: Skin is warm and dry.  Neurological:     General: No focal deficit present.     Mental Status: He is alert.     Procedures  MDM:  Imaging/radiology results:  DG Chest Portable 1 View  Result Date: 07/21/2022 CLINICAL DATA:  Shortness of breath. EXAM: PORTABLE CHEST 1 VIEW COMPARISON:  Chest radiograph dated 07/12/2022. FINDINGS: Bibasilar atelectasis/scarring. No focal consolidation, pleural effusion, or pneumothorax. Stable cardiomegaly. No acute osseous pathology. IMPRESSION: 1. No active disease. 2. Cardiomegaly. Electronically Signed   By: Elgie Collard M.D.   On: 07/21/2022 22:07    EKG results: ECG on my interpretation is normal sinus rhythm and rate, without anatomical ischemia representing  STEMI, New-onset Arrhythmia, or ischemic equivalent.   Lab results:  Results for orders placed or performed during the hospital encounter of 07/21/22 (from the past 24 hour(s))  CBC with Differential/Platelet     Status: Abnormal   Collection Time: 07/21/22  9:41 PM  Result Value Ref Range   WBC 7.0 4.0 - 10.5 K/uL   RBC 4.01 (L) 4.22 - 5.81 MIL/uL   Hemoglobin 10.9 (L) 13.0 - 17.0 g/dL   HCT 47.4 25.9 - 56.3 %   MCV 97.3 80.0 - 100.0 fL   MCH 27.2 26.0 - 34.0 pg   MCHC 27.9 (L) 30.0 - 36.0 g/dL   RDW 87.5 64.3 - 32.9 %   Platelets 208 150 - 400 K/uL   nRBC 0.0 0.0 - 0.2 %   Neutrophils Relative % 79 %   Neutro Abs 5.5 1.7 - 7.7 K/uL   Lymphocytes Relative 8 %   Lymphs Abs 0.6 (L) 0.7 - 4.0 K/uL   Monocytes Relative 12 %   Monocytes Absolute 0.9 0.1 - 1.0  K/uL   Eosinophils Relative 0 %   Eosinophils Absolute 0.0 0.0 - 0.5 K/uL   Basophils Relative 0 %   Basophils Absolute 0.0 0.0 - 0.1 K/uL   Immature Granulocytes 1 %   Abs Immature Granulocytes 0.05 0.00 - 0.07 K/uL  Resp panel by RT-PCR (RSV, Flu A&B, Covid) Anterior Nasal Swab     Status: None   Collection Time: 07/21/22  9:44 PM   Specimen: Anterior Nasal Swab  Result Value Ref Range   SARS Coronavirus 2 by RT PCR NEGATIVE NEGATIVE   Influenza A by PCR NEGATIVE NEGATIVE   Influenza B by PCR NEGATIVE NEGATIVE   Resp Syncytial Virus by PCR NEGATIVE NEGATIVE  I-Stat venous blood gas, ([facility] ED, [facility], [facility])     Status: Abnormal   Collection Time: 07/21/22  9:57 PM  Result Value Ref Range   pH, Ven 7.292 7.25 - 7.43   pCO2, Ven 109.9 (HH) 44 - 60 mmHg   pO2, Ven 107 (H) 32 - 45 mmHg   Bicarbonate 53.1 (H) 20.0 - 28.0 mmol/L   TCO2 >50 (H) 22 - 32 mmol/L   O2 Saturation 97 %   Acid-Base Excess 21.0 (H) 0.0 - 2.0 mmol/L   Sodium 138 135 - 145 mmol/L   Potassium 5.1 3.5 - 5.1 mmol/L   Calcium, Ion 1.20 1.15 - 1.40 mmol/L   HCT 37.0 (L) 39.0 - 52.0 %   Hemoglobin 12.6 (L) 13.0 - 17.0 g/dL   Sample type VENOUS    Comment NOTIFIED PHYSICIAN   I-stat chem 8, ED (not at [facility], [facility] or [facility])     Status: Abnormal   Collection Time: 07/21/22  9:58 PM  Result Value Ref Range   Sodium 138 135 - 145 mmol/L   Potassium 5.4 (H) 3.5 - 5.1 mmol/L   Chloride 87 (L) 98 - 111 mmol/L   BUN 20 8 - 23 mg/dL   Creatinine, Ser 5.18 0.61 - 1.24 mg/dL   Glucose, Bld 841 (H) 70 - 99 mg/dL   Calcium, Ion 6.60 6.30 - 1.40 mmol/L   TCO2 49 (H) 22 - 32 mmol/L   Hemoglobin 12.6 (L) 13.0 - 17.0 g/dL   HCT 16.0 (L) 10.9 - 32.3 %     Key medications administered in the ER:  Medications  methylPREDNISolone sodium succinate (SOLU-MEDROL) 125 mg/2 mL injection 125 mg (125 mg Intravenous Given 07/21/22 2235)  ipratropium-albuterol (DUONEB) 0.5-2.5 (3) MG/3ML nebulizer  solution 9 mL (9 mLs Nebulization Given 07/21/22  2210)   Medical decision making: -Vital signs are stable on BiPAP. -Patient's presentation is most consistent with acute presentation with potential threat to life or bodily function.. -Gerald Cruz is a 72 y.o. male presenting to the emergency department with shortness of breath as above.  On arrival patient is on CPAP with EMS.  Transition to BiPAP.  Presentation given the patient's severe COPD history could be consistent with COPD exacerbation.  I stat blood gas obtained and shows evidence of hypercarbia.  Patient given treatment for COPD exacerbation including steroids and breathing treatments.  Cardiac labs and basic labs obtained.  Chest x-ray my interpretation with no acute cardiopulmonary disease.  EKG as above without STEMI or ischemic equivalent.  No evidence of new onset arrhythmia.  COVID and flu testing is negative.  Troponin is normal at 15. -Additional history obtained from EMS as above.  I have also discussed his care with his facility.  They report the patient is currently being set up with hospice.  They report the patient likely should not have been sent as he is a hospice patient.  I have called his guardian who reports the patient is DNR, DNI, and is in place to see hospice.  They report they are pursuing comfort care for the patient.  They report the patient is being set up with hospice but does not yet have hospice set up at his facility. -Based on this collateral information at this time we will pursue more comfort care measures.  I have discussed with the patient's guardian next steps and given the patient has not yet been set up for hospice we will proceed with admission for comfort care.  I have discussed the patient with medicine who will admit for comfort care pending formal hospice placement.   Medical Decision Making Amount and/or Complexity of Data Reviewed Labs: ordered. Radiology: ordered.  Risk Prescription drug management. Decision regarding  hospitalization.     The plan for this patient was discussed with Dr. Dalene Seltzer, who voiced agreement and who oversaw evaluation and treatment of this patient.  Marta Lamas, MD Emergency Medicine, PGY-3  Note: Dragon medical dictation software was used in the creation of this note.   Clinical Impression:  1. Respiratory distress          Chase Caller, MD 07/21/22 2322    Alvira Monday, MD 07/22/22 [phone]

## 2022-07-21 NOTE — ED Triage Notes (Signed)
Pt BIB EMS from [facility]. Nurse reported pt  decreased loc  EMS reports pinpoint pupils, rales, dec RR, and reports pt stated wanting to die and tried to remove cpap.   EMS VS 140/72, HR 100, 69% on 6L [place], 92% on cpap EMS gave narcan 0.5mg  2100, 0.67mf 2150,

## 2022-07-21 NOTE — Progress Notes (Signed)
Pt placed on BIPAP upon EMS arrival. Pt confused but in no respiratory distress. Pending VBG results

## 2022-07-21 NOTE — H&P (Addendum)
History and Physical    Gerald Cruz AYT:[id number] DOB: September 03, 1950 DOA: 07/21/2022  DOS: the patient was seen and examined on 07/21/2022  PCP: Milda Smart, MD   Patient coming from: SNF   Chief Complaint:  Chief Complaint  Patient presents with   Respiratory Distress    HPI:  72 year old man medical history significant for chronic hypoxic respiratory failure 4 L oxygen at baseline,, COPD, hypertension, vitamin B12 deficiency, insulin-dependent diabetes mellitus type 2, hyperlipidemia, GERD, peripheral neuropathy, Parkinson disease, seizure affective disorder and mild cognitive impairment coming from nursing care facility. History mostly obtained from the epic chart review and provider notes. At the nursing home patient nurse reported decreased level of consciousness.  EMS reported pinpoint pupils, rales, decreased respiratory rate.  Patient found hypoxic 69% on 6 L nasal cannula and CPAP initially was initiated.  EMS reported patient wanting to die and tried to remove the CPAP.  There is legal guardian on the patient chart Debby Freiberg [phone] and [phone].  Called both numbers and able to speak with patient's nurse at the facility on [phone],.  Reported that patient recently has been placed to a nursing home facility however he is a hospice patient and this nursing care facility cannot take care of hospice patient.  Today found patient has altered mentation and very sick and they called EMS to bring to the hospital for evaluation.  Patient is DNR, patient has a advance care plan on the chart with stating that no cardiac and respiratory resuscitation.   ED Course:  Initial presentation to ED slightly low temperature 97, heart rate 90, respiratory rate 22 and blood pressure within normal is 141/73.  Patient was continuing to on BiPAP. Chest x-ray showed bibasilar atelectasis, no focal consolidation, pleural effusion or pneumothorax.  Stable cardiomegaly.  EKG  showed normal sinus rhythm and left atrial fascicular block.  Compared with previous EKG from 07/12/2022 with similar finding. High sensitive troponin 1 unremarkable. ABG obtained which showed pH 7.2, pCO2 109, pO2 107, bicarb 53. CBC remarkable for low hemoglobin 10.9. CMP sodium 140, bicarb 42, blood glucose 203, albumin 3.2.  Patient placed on BiPAP in the emergency department in the setting of hypercarbic and hypoxic respiratory failure.   Review of Systems:  Review of Systems  Unable to perform ROS: Mental status change    Past Medical History:  Diagnosis Date   COPD (chronic obstructive pulmonary disease) (HCC)    Diabetes mellitus    GERD (gastroesophageal reflux disease)    Hypercholesterolemia    Hypertension    Migraines    Mild mental retardation    OA (osteoarthritis)    Parkinson disease    Schizoaffective disorder     Past Surgical History:  Procedure Laterality Date   APPENDECTOMY     VEIN LIGATION AND STRIPPING       reports that he has been smoking cigarettes. He has a 108.00 pack-year smoking history. He has never used smokeless tobacco. He reports that he does not drink alcohol and does not use drugs.  No Known Allergies  Family History  Problem Relation Age of Onset   Heart attack Unknown        Mother, deceased   Cancer Brother        unsure what type    Prior to Admission medications   Medication Sig Start Date End Date Taking? Authorizing Provider  acetaminophen (TYLENOL) 325 MG tablet Take 2 tablets (650 mg total) by mouth every 8 (eight) hours as needed (  As needed for pain). Patient not taking: Reported on 07/10/2022 01/29/13   Mancel Bale, MD  ADVAIR DISKUS 250-50 MCG/ACT AEPB Inhale 1 puff into the lungs in the morning and at bedtime. 07/11/22   [provider]  albuterol (PROVENTIL) (2.5 MG/3ML) 0.083% nebulizer solution Take 2.5 mg by nebulization in the morning and at bedtime. 07/11/22   [provider]  aspirin 81 MG  chewable tablet Chew 1 tablet (81 mg total) by mouth daily. Patient not taking: Reported on 07/10/2022 01/29/13   Mancel Bale, MD  aspirin 81 MG chewable tablet Chew 1 tablet (81 mg total) by mouth daily. Patient not taking: Reported on 07/10/2022 01/29/13   Mancel Bale, MD  atorvastatin (LIPITOR) 20 MG tablet Take 20 mg by mouth at bedtime. 07/10/22   [provider]  budesonide-formoterol (SYMBICORT) 80-4.5 MCG/ACT inhaler Inhale 2 puffs into the lungs 2 (two) times daily. Patient not taking: Reported on 07/10/2022 01/29/13   Mancel Bale, MD  Carbidopa 25 MG tablet Take 25 mg by mouth See admin instructions. Take 25 mg by mouth at 9 AM, 1 PM, 5 PM, and 9 PM 07/10/22   [provider]  Cholecalciferol 125 MCG (5000 UT) TABS Take 5,000 Units by mouth in the morning. 07/11/22   [provider]  cyanocobalamin (,VITAMIN B-12,) 1000 MCG/ML injection Inject 1 mL (1,000 mcg total) into the muscle every 30 (thirty) days. Patient not taking: Reported on 07/10/2022 01/29/13   Mancel Bale, MD  docusate sodium 100 MG CAPS Take 100 mg by mouth 2 (two) times daily as needed for mild constipation. Patient not taking: Reported on 07/10/2022 01/29/13   Mancel Bale, MD  donepezil (ARICEPT) 10 MG tablet Take 10 mg by mouth at bedtime. 07/10/22   [provider]  famotidine (PEPCID) 40 MG tablet Take 1 tablet (40 mg total) by mouth 2 (two) times daily. Patient not taking: Reported on 07/10/2022 01/29/13   Mancel Bale, MD  fenofibrate 160 MG tablet Take 1 tablet (160 mg total) by mouth at bedtime. Patient not taking: Reported on 07/10/2022 01/29/13   Mancel Bale, MD  finasteride (PROSCAR) 5 MG tablet Take 5 mg by mouth in the morning. 07/11/22   [provider]  gabapentin (NEURONTIN) 300 MG capsule Take 1 capsule (300 mg total) by mouth at bedtime. Patient not taking: Reported on 07/10/2022 01/29/13   Mancel Bale, MD  HUMALOG KWIKPEN 100 UNIT/ML KwikPen Inject 5 Units into  the skin 3 (three) times daily before meals. 07/10/22   [provider]  ibuprofen (ADVIL,MOTRIN) 200 MG tablet Take 2 tablets (400 mg total) by mouth every 8 (eight) hours as needed for mild pain or moderate pain. Patient not taking: Reported on 07/10/2022 01/29/13   Mancel Bale, MD  insulin aspart (NOVOLOG) 100 UNIT/ML injection Inject 14 Units into the skin 3 (three) times daily before meals. Patient not taking: Reported on 07/10/2022 01/29/13   Mancel Bale, MD  insulin detemir (LEVEMIR) 100 UNIT/ML injection Inject 0.65 mLs (65 Units total) into the skin at bedtime. Patient not taking: Reported on 07/10/2022 01/29/13   Mancel Bale, MD  ipratropium-albuterol (DUONEB) 0.5-2.5 (3) MG/3ML SOLN Take 3 mLs by nebulization every 4 (four) hours. Patient not taking: Reported on 07/10/2022 01/29/13   Mancel Bale, MD  lamoTRIgine (LAMICTAL) 25 MG tablet Take 25 mg by mouth at bedtime. 07/10/22   [provider]  LANTUS SOLOSTAR 100 UNIT/ML Solostar Pen Inject 40 Units into the skin in the morning and at bedtime.  07/11/22   [provider]  loratadine (CLARITIN) 10 MG tablet Take 1 tablet (10 mg total) by mouth daily as needed for allergies, rhinitis or itching. Patient not taking: Reported on 07/10/2022 01/29/13   Mancel Bale, MD  LORazepam (ATIVAN) 1 MG tablet Take 1 tablet (1 mg total) by mouth every 6 (six) hours as needed for anxiety or sedation. Patient not taking: Reported on 07/10/2022 01/29/13   Mancel Bale, MD  metFORMIN (GLUCOPHAGE) 1000 MG tablet Take 1 tablet (1,000 mg total) by mouth 2 (two) times daily. Patient not taking: Reported on 07/10/2022 01/29/13   Mancel Bale, MD  metoprolol succinate (TOPROL-XL) 25 MG 24 hr tablet Take 1 tablet (25 mg total) by mouth every morning. Patient not taking: Reported on 10/09/2016 01/29/13   Mancel Bale, MD  nystatin powder Apply 1 Application topically See admin instructions. Apply to the groin area for 5 days- beginning on  07/11/2022 07/11/22   [provider]  OXYGEN Inhale 2 L/min into the lungs See admin instructions. "2  L/min every shift to maintain a saturation greater than 90%" 07/10/22   [provider]  polyethylene glycol powder (GLYCOLAX/MIRALAX) 17 GM/SCOOP powder Take 17 g by mouth at bedtime. 07/10/22   [provider]  predniSONE (DELTASONE) 20 MG tablet Take 2 tablets (40 mg total) by mouth daily with breakfast. For the next four days 07/10/22   Gerhard Munch, MD  [facility] RESPICLICK [phone] Base) MCG/ACT AEPB Inhale 2 puffs into the lungs every 6 (six) hours as needed (for shortness of breath). 07/10/22   [provider]  risperiDONE (RISPERDAL M-TABS) 1 MG disintegrating tablet Take 1 tablet (1 mg total) by mouth 3 (three) times daily. Patient not taking: Reported on 07/10/2022 01/29/13   Mancel Bale, MD  SEROQUEL XR 200 MG 24 hr tablet Take 200 mg by mouth at bedtime. 07/10/22   [provider]  Sodium Chloride, Inhalant, 7 % NEBU Inhale 1 vial into the lungs in the morning and at bedtime. 07/11/22   [provider]  SPIRIVA RESPIMAT 2.5 MCG/ACT AERS Inhale 2 each into the lungs in the morning. 07/11/22   [provider]  tiotropium (SPIRIVA) 18 MCG inhalation capsule Place 1 capsule (18 mcg total) into inhaler and inhale daily. Patient not taking: Reported on 07/10/2022 01/29/13   Mancel Bale, MD     Physical Exam: Vitals:   07/21/22 2140 07/21/22 2149 07/21/22 2200 07/21/22 2210  BP:  (!) 141/73 133/68   Pulse:  90 93   Resp:  20 (!) 22   Temp:      TempSrc:      SpO2: 99% 98% 100% 98%    Physical Exam   Labs on Admission: I have personally reviewed following labs and imaging studies  CBC: Recent Labs  Lab 07/21/22 2141 07/21/22 2157 07/21/22 2158  WBC 7.0  --   --   NEUTROABS 5.5  --   --   HGB 10.9* 12.6* 12.6*  HCT 39.0 37.0* 37.0*  MCV 97.3  --   --   PLT 208  --   --    Basic Metabolic Panel: Recent Labs  Lab  07/21/22 2141 07/21/22 2157 07/21/22 2158  NA 140 138 138  K 5.1 5.1 5.4*  CL 88*  --  87*  CO2 42*  --   --   GLUCOSE 203*  --  204*  BUN 13  --  20  CREATININE 0.50*  --  0.70  CALCIUM 8.9  --   --  GFR: Estimated Creatinine Clearance: 92.2 mL/min (by C-G formula based on SCr of 0.7 mg/dL). Liver Function Tests: Recent Labs  Lab 07/21/22 2141  AST 15  ALT 8  ALKPHOS 88  BILITOT 0.9  PROT 6.4*  ALBUMIN 3.2*   No results for input(s): "LIPASE", "AMYLASE" in the last 168 hours. No results for input(s): "AMMONIA" in the last 168 hours. Coagulation Profile: No results for input(s): "INR", "PROTIME" in the last 168 hours. Cardiac Enzymes: Recent Labs  Lab 07/21/22 2141  TROPONINIHS 15   BNP (last 3 results) Recent Labs    07/10/22 1930 07/12/22 2100  BNP 10.0 10.2   HbA1C: No results for input(s): "HGBA1C" in the last 72 hours. CBG: No results for input(s): "GLUCAP" in the last 168 hours. Lipid Profile: No results for input(s): "CHOL", "HDL", "LDLCALC", "TRIG", "CHOLHDL", "LDLDIRECT" in the last 72 hours. Thyroid Function Tests: No results for input(s): "TSH", "T4TOTAL", "FREET4", "T3FREE", "THYROIDAB" in the last 72 hours. Anemia Panel: No results for input(s): "VITAMINB12", "FOLATE", "FERRITIN", "TIBC", "IRON", "RETICCTPCT" in the last 72 hours. Urine analysis:    Component Value Date/Time   COLORURINE COLORLESS (A) 08/12/2017 1010   APPEARANCEUR CLEAR 08/12/2017 1010   LABSPEC 1.002 (L) 08/12/2017 1010   PHURINE 7.0 08/12/2017 1010   GLUCOSEU NEGATIVE 08/12/2017 1010   HGBUR NEGATIVE 08/12/2017 1010   BILIRUBINUR NEGATIVE 08/12/2017 1010   KETONESUR NEGATIVE 08/12/2017 1010   PROTEINUR NEGATIVE 08/12/2017 1010   UROBILINOGEN 0.2 11/30/2012 0140   NITRITE NEGATIVE 08/12/2017 1010   LEUKOCYTESUR NEGATIVE 08/12/2017 1010    Radiological Exams on Admission: I have personally reviewed images DG Chest Portable 1 View  Result Date:  07/21/2022 CLINICAL DATA:  Shortness of breath. EXAM: PORTABLE CHEST 1 VIEW COMPARISON:  Chest radiograph dated 07/12/2022. FINDINGS: Bibasilar atelectasis/scarring. No focal consolidation, pleural effusion, or pneumothorax. Stable cardiomegaly. No acute osseous pathology. IMPRESSION: 1. No active disease. 2. Cardiomegaly. Electronically Signed   By: Elgie Collard M.D.   On: 07/21/2022 22:07    EKG: My personal interpretation of EKG shows: Sinus rhythm with left anterior fascicular block.    Assessment/Plan: Principal Problem:   Acute hypoxic respiratory failure (HCC) Active Problems:   COPD with acute exacerbation (HCC)   Acute metabolic encephalopathy   GERD (gastroesophageal reflux disease)   Hypertension   Type 2 diabetes mellitus without complication, with long-term current use of insulin (HCC)   Schizoaffective disorder (HCC)    Assessment and Plan: No notes have been filed under this hospital service. Service: Hospitalist  Acute metabolic encephalopathy Per chart review nursing home report patient was found to have altered mentation in the evening and nursing home called EMS.  On initial presentation they found O2 sat 69% on 6 L oxygen.  Patient immediately started on CPAP. -Acute metabolic encephalopathy likely secondary from hypercarbic and hypoxic respiratory failure. - Obtaining head CT scan to rule out any acute intracranial abnormality. -Checking TSH, ammonia, salicylate, acetaminophen level. - Check UDS. - Continue fall precaution - Neurocheck every 4 hours. - Continue to monitor mentation for improvement. -Continue telemonitoring  Acute hypoxic respiratory failure Acute hypercarbic respiratory failure Acute exacerbation of COPD History of COPD and chronic hypoxic respiratory failure-on oxygen 4 L at baseline - Initial evaluation patient found to have O2 sat 69% on 6 L oxygen. ABG showed pH 7.2, pCO2 109.  Patient immediately placed on CPAP and room 280 and in  the ED continued the BiPAP. Patient has no leukocytosis, afebrile and not hypotensive.  Chest x-ray unremarkable  for any consolidation and no acute abnormality.  It showed bibasilar atelectasis. -Obtaining sputum culture. - Continue COPD protocol - Continue scheduled DuoNeb every 6 hours and as needed Xopenex every 6 hour -Continue azithromycin IV for 5 days course -Continue BiPAP overnight. -Ordered ABG for the morning.  History of hypertension - Holding blood pressure regimen as patient is NPO. -Continue hydralazine 10 mg every 8 hours as needed for systolic blood pressure more than 90  History of schizoaffective disorder Parkinson's disease Mild cognitive impairment - Currently patient is NPO is on BiPAP. - Holding home medications include lamotrigine, Seroquel, and risperidone.  Hyperlipidemia - Holding Lipitor as patient is NPO.  GERD - Patient is history of GERD.  As patient is on high doses steroid as well as any-year-old continue IV Protonix.  History of DM type II - At home patient is on insulin and oral diabetic regimen - Per chart review previous A1c 8.4 in 2014 - Currently NPO. - Continue to check POC blood glucose twice daily in the setting of n.p.o. -check A1c  There is legal guardian on the patient chart Debby Freiberg [phone] and [phone].  Called both numbers and able to speak with patient's nurse at the facility on [phone],.  Reported that patient recently has been placed to a nursing home facility however he is a hospice patient and this nursing care facility cannot take care of hospice patient.  Please reach out to legal guardian again in the morning.  I have consulted to palliative care for hospice care placement.    DVT prophylaxis: Continue SCD.  Holding any pharmacological prophylaxis until CT head ruled out any concern for hemorrhage Code Status: DNR.  Reviewed advance care directive which states DO NOT RESUSCITATE. Diet: npo Family  Communication:  legal guardian on the patient chart Debby Freiberg [phone] and [phone]. Disposition Plan: Follow-up with palliative for hospice faclity placement Consults: Palliative care Admission status: Inpatient, Step Down Unit   Tereasa Coop, MD [facility] 07/22/2022, 1:00 AM    If 7PM-7AM, please contact night-coverage [email or web address] Password [email or web address]  Reviewed aspect

## 2022-07-22 ENCOUNTER — Inpatient Hospital Stay (HOSPITAL_COMMUNITY)

## 2022-07-22 ENCOUNTER — Encounter (HOSPITAL_COMMUNITY): Payer: Self-pay | Admitting: Internal Medicine

## 2022-07-22 DIAGNOSIS — Z7189 Other specified counseling: Secondary | ICD-10-CM

## 2022-07-22 DIAGNOSIS — G9341 Metabolic encephalopathy: Secondary | ICD-10-CM

## 2022-07-22 DIAGNOSIS — J9601 Acute respiratory failure with hypoxia: Secondary | ICD-10-CM

## 2022-07-22 DIAGNOSIS — Z515 Encounter for palliative care: Secondary | ICD-10-CM

## 2022-07-22 DIAGNOSIS — J441 Chronic obstructive pulmonary disease with (acute) exacerbation: Principal | ICD-10-CM

## 2022-07-22 LAB — I-STAT ARTERIAL BLOOD GAS, ED
Acid-Base Excess: 16 mmol/L — ABNORMAL HIGH (ref 0.0–2.0)
Bicarbonate: 47.3 mmol/L — ABNORMAL HIGH (ref 20.0–28.0)
Calcium, Ion: 1.24 mmol/L (ref 1.15–1.40)
HCT: 36 % — ABNORMAL LOW (ref 39.0–52.0)
Hemoglobin: 12.2 g/dL — ABNORMAL LOW (ref 13.0–17.0)
O2 Saturation: 84 %
Patient temperature: 98.6
Potassium: 4.7 mmol/L (ref 3.5–5.1)
Sodium: 137 mmol/L (ref 135–145)
TCO2: >50 mmol/L — ABNORMAL HIGH (ref 22–32)
pCO2 arterial: 101 mmHg (ref 32–48)
pH, Arterial: 7.279 — ABNORMAL LOW (ref 7.35–7.45)
pO2, Arterial: 60 mmHg — ABNORMAL LOW (ref 83–108)

## 2022-07-22 LAB — HIV ANTIBODY (ROUTINE TESTING W REFLEX): HIV Screen 4th Generation wRfx: NONREACTIVE

## 2022-07-22 LAB — BRAIN NATRIURETIC PEPTIDE: B Natriuretic Peptide: 19 pg/mL (ref 0.0–100.0)

## 2022-07-22 LAB — HEMOGLOBIN A1C
Hgb A1c MFr Bld: 9.1 % — ABNORMAL HIGH (ref 4.8–5.6)
Mean Plasma Glucose: 214.47 mg/dL

## 2022-07-22 LAB — TSH: TSH: 0.608 u[IU]/mL (ref 0.350–4.500)

## 2022-07-22 LAB — TROPONIN I (HIGH SENSITIVITY): Troponin I (High Sensitivity): 12 ng/L (ref <18)

## 2022-07-22 MED ORDER — ENOXAPARIN SODIUM 40 MG/0.4ML IJ SOSY: 40.0000 mg | PREFILLED_SYRINGE | INTRAMUSCULAR | Status: DC

## 2022-07-22 MED ORDER — HALOPERIDOL LACTATE 5 MG/ML IJ SOLN
2.0000 mg | Freq: Four times a day (QID) | INTRAMUSCULAR | Status: DC | PRN
  Administered 2022-07-22: 2 mg via INTRAVENOUS
  Filled 2022-07-22: qty 1

## 2022-07-22 MED ORDER — HALOPERIDOL LACTATE 5 MG/ML IJ SOLN: 1.0000 mg | Freq: Once | INTRAMUSCULAR | Status: DC

## 2022-07-22 MED ORDER — HYDRALAZINE HCL 20 MG/ML IJ SOLN: 10.0000 mg | Freq: Four times a day (QID) | INTRAMUSCULAR | Status: DC | PRN

## 2022-07-22 MED ORDER — POLYVINYL ALCOHOL 1.4 % OP SOLN: 1.0000 [drp] | Freq: Four times a day (QID) | OPHTHALMIC | Status: DC | PRN

## 2022-07-22 MED ORDER — ONDANSETRON HCL 4 MG/2ML IJ SOLN: 4.0000 mg | Freq: Four times a day (QID) | INTRAMUSCULAR | Status: DC | PRN

## 2022-07-22 MED ORDER — IPRATROPIUM BROMIDE 0.02 % IN SOLN
0.5000 mg | Freq: Four times a day (QID) | RESPIRATORY_TRACT | Status: DC
  Administered 2022-07-22 (×2): 0.5 mg via RESPIRATORY_TRACT
  Filled 2022-07-22 (×2): qty 2.5

## 2022-07-22 MED ORDER — ACETAMINOPHEN 650 MG RE SUPP: 650.0000 mg | RECTAL | Status: DC | PRN

## 2022-07-22 MED ORDER — SODIUM CHLORIDE 0.9 % IV SOLN
500.0000 mg | Freq: Every day | INTRAVENOUS | Status: DC
  Administered 2022-07-22: 500 mg via INTRAVENOUS
  Filled 2022-07-22 (×2): qty 5

## 2022-07-22 MED ORDER — MORPHINE BOLUS VIA INFUSION: 2.0000 mg | INTRAVENOUS | Status: DC | PRN

## 2022-07-22 MED ORDER — LACTATED RINGERS IV SOLN: INTRAVENOUS | Status: DC

## 2022-07-22 MED ORDER — MORPHINE 100MG IN NS 100ML (1MG/ML) PREMIX INFUSION: 2.0000 mg/h | INTRAVENOUS | Status: DC

## 2022-07-22 MED ORDER — BISACODYL 10 MG RE SUPP: 10.0000 mg | Freq: Every day | RECTAL | Status: DC | PRN

## 2022-07-22 MED ORDER — BIOTENE DRY MOUTH MT LIQD: 15.0000 mL | OROMUCOSAL | Status: DC | PRN

## 2022-07-22 MED ORDER — GLYCOPYRROLATE 0.2 MG/ML IJ SOLN: 0.4000 mg | INTRAMUSCULAR | Status: DC | PRN

## 2022-07-22 MED ORDER — MORPHINE SULFATE (PF) 2 MG/ML IV SOLN
2.0000 mg | INTRAVENOUS | Status: DC | PRN
  Administered 2022-07-22: 2 mg via INTRAVENOUS
  Filled 2022-07-22: qty 1

## 2022-07-22 MED ORDER — PANTOPRAZOLE SODIUM 40 MG IV SOLR: 40.0000 mg | Freq: Every day | INTRAVENOUS | Status: DC

## 2022-07-22 MED ORDER — LORAZEPAM 2 MG/ML IJ SOLN
1.0000 mg | INTRAMUSCULAR | Status: DC | PRN
  Administered 2022-07-22: 1 mg via INTRAVENOUS
  Filled 2022-07-22: qty 1

## 2022-07-22 MED ORDER — HALOPERIDOL LACTATE 5 MG/ML IJ SOLN
2.0000 mg | Freq: Once | INTRAMUSCULAR | Status: AC
  Administered 2022-07-22: 2 mg via INTRAVENOUS
  Filled 2022-07-22: qty 1

## 2022-07-22 MED ORDER — LEVALBUTEROL HCL 0.63 MG/3ML IN NEBU: 0.6300 mg | INHALATION_SOLUTION | Freq: Four times a day (QID) | RESPIRATORY_TRACT | Status: DC | PRN

## 2022-07-22 MED ORDER — METHYLPREDNISOLONE SODIUM SUCC 125 MG IJ SOLR: 125.0000 mg | Freq: Every day | INTRAMUSCULAR | Status: DC

## 2022-07-22 MED ORDER — MORPHINE SULFATE (PF) 2 MG/ML IV SOLN: 2.0000 mg | INTRAVENOUS | Status: DC | PRN

## 2022-07-22 NOTE — Progress Notes (Signed)
  Overnight patient remained very agitated trying to take off the BiPAP mask multiple times.  It was very hard to orient patient.  Bedside nurse and respiratory therapist tried their best to orient patient and keep the BiPAP on however patient remained very agitated.  He continues to scream and states repeated I cannot breathe every time with attempt of BiPAP mask placement and even with nasal cannula oxygen.  I have to give him  Haldol 2 mg x 2 to calm down.  With Haldol agitation slightly improved and currently tolerating the facemask high flow oxygen.

## 2022-07-22 NOTE — ED Notes (Signed)
Pt ripping off the bipap and EKG monitors and yelling "I can't breath". Pt refusing to let staff reapply the bipap. Admitting provider  made aware and at the bedside

## 2022-07-22 NOTE — ED Notes (Signed)
MD bedside, recommended continue  Bipap. Pt was minimally responsive to deep pain stimulation

## 2022-07-22 NOTE — Discharge Summary (Signed)
Physician Discharge Summary  Gerald Cruz UJW:[id number] DOB: [birth year] DOA: 07/21/2022  PCP: Milda Smart, MD  Admit date: 07/21/2022 Discharge date: 07/22/2022  Admitted From: SNF Disposition: Residential hospice   CODE STATUS: Comfort Care  Brief/Interim Summary: Patient is a 72 year old male with history of  chronic hypoxic respiratory failure 4 L oxygen at baseline,, COPD, hypertension, vitamin B12 deficiency, insulin-dependent diabetes mellitus type 2, hyperlipidemia, GERD, peripheral neuropathy, Parkinson disease, seizure affective disorder and mild cognitive impairment who was brought from nursing facility because of decreased responsiveness, lethargic.  EMS found him with pinpoint pupils, rales, decreased respiratory rate and hypoxic at 69% on 6 L of nasal cannula. Chest x-ray showed bibasilar atelectasis, no focal consolidation, pleural effusion or pneumothorax,stable cardiomegaly.  Apparently patient is DNR and there was plan to transition him to residential hospice from the nursing facility.  This morning ABG showed pCO2 of more than 100, put on BiPAP.  Extensive discussion about goals of care discussed with legal guardian Cassandra Masseendurg and he has been transitioned to comfort care.  Plan is to move into residential hospice today  Following problems were addressed during the hospitalization: Goals of care: Patient was brought from nursing facility because of decreased responsiveness, lethargic.  EMS found him with pinpoint pupils, rales, decreased respiratory rate and hypoxic at 69% on 6 L of nasal cannula. Chest x-ray showed bibasilar atelectasis, no focal consolidation, pleural effusion or pneumothorax,stable cardiomegaly.  Patient is DNR and there was plan to transition him to residential hospice from the nursing facility but it was getting delayed.    Extensive discussion about goals of care discussed with legal guardian Cassandra Masseendurg .I reviewed his MOST form.   Ms. [name] wants [place] to respect his wishes and requested to be transitioned to comfort care.  She will contact his family. Started on comfort measures.  Palliative care also consulted. Plan to move to residential hospice   Acute hypoxic respiratory failure/COPD/chronic hypoxic respiratory failure on 4 L at baseline: Currently on comfort care   Other medical problems: Hypertension, schizoaffective disorder, Parkinson disease, hyperlipidemia, GERD, type 2 diabetes  Discharge Diagnoses:  Principal Problem:   Acute hypoxic respiratory failure (HCC) Active Problems:   COPD with acute exacerbation (HCC)   Acute metabolic encephalopathy   GERD (gastroesophageal reflux disease)   Hypertension   Diabetes mellitus (HCC)   Schizoaffective disorder (HCC)   COPD exacerbation (HCC)    Discharge Instructions   Allergies as of 07/22/2022   No Known Allergies      Medication List     STOP taking these medications    Advair Diskus 250-50 MCG/ACT Aepb Generic drug: fluticasone-salmeterol   albuterol (2.5 MG/3ML) 0.083% nebulizer solution Commonly known as: PROVENTIL   aspirin 81 MG chewable tablet   atorvastatin 20 MG tablet Commonly known as: LIPITOR   busPIRone 10 MG tablet Commonly known as: BUSPAR   Carbidopa 25 MG tablet   Cholecalciferol 125 MCG (5000 UT) Tabs   donepezil 10 MG tablet Commonly known as: ARICEPT   finasteride 5 MG tablet Commonly known as: PROSCAR   HumaLOG KwikPen 100 UNIT/ML KwikPen Generic drug: insulin lispro   hydrOXYzine 25 MG tablet Commonly known as: ATARAX   lamoTRIgine 25 MG tablet Commonly known as: LAMICTAL   Lantus SoloStar 100 UNIT/ML Solostar Pen Generic drug: insulin glargine   LORazepam 1 MG tablet Commonly known as: ATIVAN   morphine 20 MG/5ML solution   OXYGEN   polyethylene glycol powder 17 GM/SCOOP powder Commonly known as: GLYCOLAX/MIRALAX  ProAir RespiClick 108 (90 Base) MCG/ACT Aepb Generic drug: Albuterol  Sulfate   SEROquel XR 200 MG 24 hr tablet Generic drug: QUEtiapine   Sodium Chloride (Inhalant) 7 % Nebu   Spiriva Respimat 2.5 MCG/ACT Aers Generic drug: Tiotropium Bromide Monohydrate        No Known Allergies  Consultations: Palliative care   Procedures/Studies: DG Chest Portable 1 View  Result Date: 07/21/2022 CLINICAL DATA:  Shortness of breath. EXAM: PORTABLE CHEST 1 VIEW COMPARISON:  Chest radiograph dated 07/12/2022. FINDINGS: Bibasilar atelectasis/scarring. No focal consolidation, pleural effusion, or pneumothorax. Stable cardiomegaly. No acute osseous pathology. IMPRESSION: 1. No active disease. 2. Cardiomegaly. Electronically Signed   By: Elgie Collard M.D.   On: 07/21/2022 22:07   CT Head Wo Contrast  Result Date: 07/12/2022 CLINICAL DATA:  Headaches and neck pain, initial encounter EXAM: CT HEAD WITHOUT CONTRAST CT CERVICAL SPINE WITHOUT CONTRAST TECHNIQUE: Multidetector CT imaging of the head and cervical spine was performed following the standard protocol without intravenous contrast. Multiplanar CT image reconstructions of the cervical spine were also generated. RADIATION DOSE REDUCTION: This exam was performed according to the departmental dose-optimization program which includes automated exposure control, adjustment of the mA and/or kV according to patient size and/or use of iterative reconstruction technique. COMPARISON:  11/02/2009 FINDINGS: CT HEAD FINDINGS Brain: No evidence of acute infarction, hemorrhage, hydrocephalus, extra-axial collection or mass lesion/mass effect. Atrophic changes are noted. Vascular: No hyperdense vessel or unexpected calcification. Skull: Normal. Negative for fracture or focal lesion. Sinuses/Orbits: No acute finding. Other: None. CT CERVICAL SPINE FINDINGS Alignment: Loss of normal cervical lordosis is noted. Skull base and vertebrae: 7 cervical segments are well visualized. Vertebral body height is well maintained. Facet hypertrophic  changes and osteophytic changes are noted. No acute fracture or acute facet abnormality is noted. Soft tissues and spinal canal: Surrounding soft tissue structures are within normal limits. Upper chest: Visualized lung apices show emphysematous changes. Other: None IMPRESSION: CT of the head: Chronic atrophic changes without acute abnormality. CT of the cervical spine: Multilevel degenerative change without acute abnormality. Electronically Signed   By: Alcide Clever M.D.   On: 07/12/2022 22:27   CT Cervical Spine Wo Contrast  Result Date: 07/12/2022 CLINICAL DATA:  Headaches and neck pain, initial encounter EXAM: CT HEAD WITHOUT CONTRAST CT CERVICAL SPINE WITHOUT CONTRAST TECHNIQUE: Multidetector CT imaging of the head and cervical spine was performed following the standard protocol without intravenous contrast. Multiplanar CT image reconstructions of the cervical spine were also generated. RADIATION DOSE REDUCTION: This exam was performed according to the departmental dose-optimization program which includes automated exposure control, adjustment of the mA and/or kV according to patient size and/or use of iterative reconstruction technique. COMPARISON:  11/02/2009 FINDINGS: CT HEAD FINDINGS Brain: No evidence of acute infarction, hemorrhage, hydrocephalus, extra-axial collection or mass lesion/mass effect. Atrophic changes are noted. Vascular: No hyperdense vessel or unexpected calcification. Skull: Normal. Negative for fracture or focal lesion. Sinuses/Orbits: No acute finding. Other: None. CT CERVICAL SPINE FINDINGS Alignment: Loss of normal cervical lordosis is noted. Skull base and vertebrae: 7 cervical segments are well visualized. Vertebral body height is well maintained. Facet hypertrophic changes and osteophytic changes are noted. No acute fracture or acute facet abnormality is noted. Soft tissues and spinal canal: Surrounding soft tissue structures are within normal limits. Upper chest: Visualized lung  apices show emphysematous changes. Other: None IMPRESSION: CT of the head: Chronic atrophic changes without acute abnormality. CT of the cervical spine: Multilevel degenerative change without acute abnormality. Electronically  Signed   By: Alcide Clever M.D.   On: 07/12/2022 22:27   DG Chest Portable 1 View  Result Date: 07/12/2022 CLINICAL DATA:  Cough shortness of breath. EXAM: PORTABLE CHEST 1 VIEW COMPARISON:  Chest radiograph dated 07/10/2022. FINDINGS: The heart is enlarged. Mild bilateral lower lung predominant interstitial and airspace opacities appear similar to prior exam. No pleural effusion or pneumothorax. Degenerative changes are seen in the spine. IMPRESSION: Mild bilateral lower lung predominant interstitial and airspace opacities appear similar to prior exam. Electronically Signed   By: Romona Curls M.D.   On: 07/12/2022 20:56   DG Chest 1 View  Result Date: 07/10/2022 CLINICAL DATA:  Shortness of breath EXAM: PORTABLE CHEST 1 VIEW COMPARISON:  01/17/2013 FINDINGS: Cardiac shadow is mildly enlarged but accentuated by the frontal technique. Aortic calcifications are noted. Lungs are well aerated bilaterally. Mild basilar atelectasis is seen. No bony abnormality is noted. IMPRESSION: Mild bibasilar atelectasis. Electronically Signed   By: Alcide Clever M.D.   On: 07/10/2022 19:45      Subjective: See my progress note for details  Discharge Exam: Vitals:   07/22/22 0801 07/22/22 1051  BP:  (!) 156/83  Pulse:  100  Resp:  13  Temp:    SpO2: 100% (!) 77%   Vitals:   07/22/22 0753 07/22/22 0755 07/22/22 0801 07/22/22 1051  BP: 136/78   (!) 156/83  Pulse: 99 99  100  Resp: (!) 21 16  13   Temp:      TempSrc:      SpO2: 100% 100% 100% (!) 77%    General: Pt is obtunded, lethargic, unresponsive Cardiovascular: RRR, S1/S2 +, no rubs, no gallops Respiratory: Diminished sounds bilaterally Abdominal: Distended, nontender, bowel sounds + Extremities: lower extremity edema, no  cyanosis    The results of significant diagnostics from this hospitalization (including imaging, microbiology, ancillary and laboratory) are listed below for reference.     Microbiology: Recent Results (from the past 240 hour(s))  Resp panel by RT-PCR (RSV, Flu A&B, Covid) Anterior Nasal Swab     Status: None   Collection Time: 07/12/22  9:41 PM   Specimen: Anterior Nasal Swab  Result Value Ref Range Status   SARS Coronavirus 2 by RT PCR NEGATIVE NEGATIVE Final    Comment: (NOTE) SARS-CoV-2 target nucleic acids are NOT DETECTED.  The SARS-CoV-2 RNA is generally detectable in upper respiratory specimens during the acute phase of infection. The lowest concentration of SARS-CoV-2 viral copies this assay can detect is 138 copies/mL. A negative result does not preclude SARS-Cov-2 infection and should not be used as the sole basis for treatment or other patient management decisions. A negative result may occur with  improper specimen collection/handling, submission of specimen other than nasopharyngeal swab, presence of viral mutation(s) within the areas targeted by this assay, and inadequate number of viral copies(<138 copies/mL). A negative result must be combined with clinical observations, patient history, and epidemiological information. The expected result is Negative.  Fact Sheet for Patients:  BloggerCourse.com  Fact Sheet for Healthcare Providers:  SeriousBroker.it  This test is no t yet approved or cleared by the [place] FDA and  has been authorized for detection and/or diagnosis of SARS-CoV-2 by FDA under an Emergency Use Authorization (EUA). This EUA will remain  in effect (meaning this test can be used) for the duration of the COVID-19 declaration under Section 564(b)(1) of the Act, 21 U.S.C.section 360bbb-3(b)(1), unless the authorization is terminated  or revoked sooner.  Influenza A by PCR NEGATIVE  NEGATIVE Final   Influenza B by PCR NEGATIVE NEGATIVE Final    Comment: (NOTE) The Xpert Xpress SARS-CoV-2/FLU/RSV plus assay is intended as an aid in the diagnosis of influenza from Nasopharyngeal swab specimens and should not be used as a sole basis for treatment. Nasal washings and aspirates are unacceptable for Xpert Xpress SARS-CoV-2/FLU/RSV testing.  Fact Sheet for Patients: BloggerCourse.com  Fact Sheet for Healthcare Providers: SeriousBroker.it  This test is not yet approved or cleared by the [place] FDA and has been authorized for detection and/or diagnosis of SARS-CoV-2 by FDA under an Emergency Use Authorization (EUA). This EUA will remain in effect (meaning this test can be used) for the duration of the COVID-19 declaration under Section 564(b)(1) of the Act, 21 U.S.C. section 360bbb-3(b)(1), unless the authorization is terminated or revoked.     Resp Syncytial Virus by PCR NEGATIVE NEGATIVE Final    Comment: (NOTE) Fact Sheet for Patients: BloggerCourse.com  Fact Sheet for Healthcare Providers: SeriousBroker.it  This test is not yet approved or cleared by the [place] FDA and has been authorized for detection and/or diagnosis of SARS-CoV-2 by FDA under an Emergency Use Authorization (EUA). This EUA will remain in effect (meaning this test can be used) for the duration of the COVID-19 declaration under Section 564(b)(1) of the Act, 21 U.S.C. section 360bbb-3(b)(1), unless the authorization is terminated or revoked.  Performed at [facility], [address] [place], [place] [address]   Resp panel by RT-PCR (RSV, Flu A&B, Covid) Anterior Nasal Swab     Status: None   Collection Time: 07/21/22  9:44 PM   Specimen: Anterior Nasal Swab  Result Value Ref Range Status   SARS Coronavirus 2 by RT PCR NEGATIVE NEGATIVE Final   Influenza A  by PCR NEGATIVE NEGATIVE Final   Influenza B by PCR NEGATIVE NEGATIVE Final    Comment: (NOTE) The Xpert Xpress SARS-CoV-2/FLU/RSV plus assay is intended as an aid in the diagnosis of influenza from Nasopharyngeal swab specimens and should not be used as a sole basis for treatment. Nasal washings and aspirates are unacceptable for Xpert Xpress SARS-CoV-2/FLU/RSV testing.  Fact Sheet for Patients: BloggerCourse.com  Fact Sheet for Healthcare Providers: SeriousBroker.it  This test is not yet approved or cleared by the [place] FDA and has been authorized for detection and/or diagnosis of SARS-CoV-2 by FDA under an Emergency Use Authorization (EUA). This EUA will remain in effect (meaning this test can be used) for the duration of the COVID-19 declaration under Section 564(b)(1) of the Act, 21 U.S.C. section 360bbb-3(b)(1), unless the authorization is terminated or revoked.     Resp Syncytial Virus by PCR NEGATIVE NEGATIVE Final    Comment: (NOTE) Fact Sheet for Patients: BloggerCourse.com  Fact Sheet for Healthcare Providers: SeriousBroker.it  This test is not yet approved or cleared by the [place] FDA and has been authorized for detection and/or diagnosis of SARS-CoV-2 by FDA under an Emergency Use Authorization (EUA). This EUA will remain in effect (meaning this test can be used) for the duration of the COVID-19 declaration under Section 564(b)(1) of the Act, 21 U.S.C. section 360bbb-3(b)(1), unless the authorization is terminated or revoked.  Performed at [facility], [address] [place], [place] [address]      Labs: BNP (last 3 results) Recent Labs    07/10/22 1930 07/12/22 2100 07/22/22 0234  BNP 10.0 10.2 19.0   Basic Metabolic Panel: Recent Labs  Lab 07/21/22  2141 07/21/22 2157 07/21/22 2158 07/22/22 0308  NA 140 138 138 137  K 5.1  5.1 5.4* 4.7  CL 88*  --  87*  --   CO2 42*  --   --   --   GLUCOSE 203*  --  204*  --   BUN 13  --  20  --   CREATININE 0.50*  --  0.70  --   CALCIUM 8.9  --   --   --    Liver Function Tests: Recent Labs  Lab 07/21/22 2141  AST 15  ALT 8  ALKPHOS 88  BILITOT 0.9  PROT 6.4*  ALBUMIN 3.2*   No results for input(s): "LIPASE", "AMYLASE" in the last 168 hours. No results for input(s): "AMMONIA" in the last 168 hours. CBC: Recent Labs  Lab 07/21/22 2141 07/21/22 2157 07/21/22 2158 07/22/22 0308  WBC 7.0  --   --   --   NEUTROABS 5.5  --   --   --   HGB 10.9* 12.6* 12.6* 12.2*  HCT 39.0 37.0* 37.0* 36.0*  MCV 97.3  --   --   --   PLT 208  --   --   --    Cardiac Enzymes: No results for input(s): "CKTOTAL", "CKMB", "CKMBINDEX", "TROPONINI" in the last 168 hours. BNP: Invalid input(s): "POCBNP" CBG: No results for input(s): "GLUCAP" in the last 168 hours. D-Dimer No results for input(s): "DDIMER" in the last 72 hours. Hgb A1c Recent Labs    07/21/22 2141  HGBA1C 9.1*   Lipid Profile No results for input(s): "CHOL", "HDL", "LDLCALC", "TRIG", "CHOLHDL", "LDLDIRECT" in the last 72 hours. Thyroid function studies Recent Labs    07/22/22 0234  TSH 0.608   Anemia work up No results for input(s): "VITAMINB12", "FOLATE", "FERRITIN", "TIBC", "IRON", "RETICCTPCT" in the last 72 hours. Urinalysis    Component Value Date/Time   COLORURINE COLORLESS (A) 08/12/2017 1010   APPEARANCEUR CLEAR 08/12/2017 1010   LABSPEC 1.002 (L) 08/12/2017 1010   PHURINE 7.0 08/12/2017 1010   GLUCOSEU NEGATIVE 08/12/2017 1010   HGBUR NEGATIVE 08/12/2017 1010   BILIRUBINUR NEGATIVE 08/12/2017 1010   KETONESUR NEGATIVE 08/12/2017 1010   PROTEINUR NEGATIVE 08/12/2017 1010   UROBILINOGEN 0.2 11/30/2012 0140   NITRITE NEGATIVE 08/12/2017 1010   LEUKOCYTESUR NEGATIVE 08/12/2017 1010   Sepsis Labs Recent Labs  Lab 07/21/22 2141  WBC 7.0   Microbiology Recent Results (from the past  240 hour(s))  Resp panel by RT-PCR (RSV, Flu A&B, Covid) Anterior Nasal Swab     Status: None   Collection Time: 07/12/22  9:41 PM   Specimen: Anterior Nasal Swab  Result Value Ref Range Status   SARS Coronavirus 2 by RT PCR NEGATIVE NEGATIVE Final    Comment: (NOTE) SARS-CoV-2 target nucleic acids are NOT DETECTED.  The SARS-CoV-2 RNA is generally detectable in upper respiratory specimens during the acute phase of infection. The lowest concentration of SARS-CoV-2 viral copies this assay can detect is 138 copies/mL. A negative result does not preclude SARS-Cov-2 infection and should not be used as the sole basis for treatment or other patient management decisions. A negative result may occur with  improper specimen collection/handling, submission of specimen other than nasopharyngeal swab, presence of viral mutation(s) within the areas targeted by this assay, and inadequate number of viral copies(<138 copies/mL). A negative result must be combined with clinical observations, patient history, and epidemiological information. The expected result is Negative.  Fact Sheet for Patients:  BloggerCourse.com  Fact  Sheet for Healthcare Providers:  SeriousBroker.it  This test is no t yet approved or cleared by the [place] FDA and  has been authorized for detection and/or diagnosis of SARS-CoV-2 by FDA under an Emergency Use Authorization (EUA). This EUA will remain  in effect (meaning this test can be used) for the duration of the COVID-19 declaration under Section 564(b)(1) of the Act, 21 U.S.C.section 360bbb-3(b)(1), unless the authorization is terminated  or revoked sooner.       Influenza A by PCR NEGATIVE NEGATIVE Final   Influenza B by PCR NEGATIVE NEGATIVE Final    Comment: (NOTE) The Xpert Xpress SARS-CoV-2/FLU/RSV plus assay is intended as an aid in the diagnosis of influenza from Nasopharyngeal swab specimens and should  not be used as a sole basis for treatment. Nasal washings and aspirates are unacceptable for Xpert Xpress SARS-CoV-2/FLU/RSV testing.  Fact Sheet for Patients: BloggerCourse.com  Fact Sheet for Healthcare Providers: SeriousBroker.it  This test is not yet approved or cleared by the [place] FDA and has been authorized for detection and/or diagnosis of SARS-CoV-2 by FDA under an Emergency Use Authorization (EUA). This EUA will remain in effect (meaning this test can be used) for the duration of the COVID-19 declaration under Section 564(b)(1) of the Act, 21 U.S.C. section 360bbb-3(b)(1), unless the authorization is terminated or revoked.     Resp Syncytial Virus by PCR NEGATIVE NEGATIVE Final    Comment: (NOTE) Fact Sheet for Patients: BloggerCourse.com  Fact Sheet for Healthcare Providers: SeriousBroker.it  This test is not yet approved or cleared by the [place] FDA and has been authorized for detection and/or diagnosis of SARS-CoV-2 by FDA under an Emergency Use Authorization (EUA). This EUA will remain in effect (meaning this test can be used) for the duration of the COVID-19 declaration under Section 564(b)(1) of the Act, 21 U.S.C. section 360bbb-3(b)(1), unless the authorization is terminated or revoked.  Performed at [facility], [address] [place], [place] [address]   Resp panel by RT-PCR (RSV, Flu A&B, Covid) Anterior Nasal Swab     Status: None   Collection Time: 07/21/22  9:44 PM   Specimen: Anterior Nasal Swab  Result Value Ref Range Status   SARS Coronavirus 2 by RT PCR NEGATIVE NEGATIVE Final   Influenza A by PCR NEGATIVE NEGATIVE Final   Influenza B by PCR NEGATIVE NEGATIVE Final    Comment: (NOTE) The Xpert Xpress SARS-CoV-2/FLU/RSV plus assay is intended as an aid in the diagnosis of influenza from Nasopharyngeal swab  specimens and should not be used as a sole basis for treatment. Nasal washings and aspirates are unacceptable for Xpert Xpress SARS-CoV-2/FLU/RSV testing.  Fact Sheet for Patients: BloggerCourse.com  Fact Sheet for Healthcare Providers: SeriousBroker.it  This test is not yet approved or cleared by the [place] FDA and has been authorized for detection and/or diagnosis of SARS-CoV-2 by FDA under an Emergency Use Authorization (EUA). This EUA will remain in effect (meaning this test can be used) for the duration of the COVID-19 declaration under Section 564(b)(1) of the Act, 21 U.S.C. section 360bbb-3(b)(1), unless the authorization is terminated or revoked.     Resp Syncytial Virus by PCR NEGATIVE NEGATIVE Final    Comment: (NOTE) Fact Sheet for Patients: BloggerCourse.com  Fact Sheet for Healthcare Providers: SeriousBroker.it  This test is not yet approved or cleared by the [place] FDA and has been authorized for detection and/or diagnosis of SARS-CoV-2 by FDA under an Emergency Use Authorization (EUA). This EUA  will remain in effect (meaning this test can be used) for the duration of the COVID-19 declaration under Section 564(b)(1) of the Act, 21 U.S.C. section 360bbb-3(b)(1), unless the authorization is terminated or revoked.  Performed at [facility], [address] [place], [place] [address]     Please note: You were cared for by a hospitalist during your hospital stay. Once you are discharged, your primary care physician will handle any further medical issues. Please note that NO REFILLS for any discharge medications will be authorized once you are discharged, as it is imperative that you return to your primary care physician (or establish a relationship with a primary care physician if you do not have one) for your post hospital discharge needs so that  they can reassess your need for medications and monitor your lab values.    Time coordinating discharge: 40 minutes  SIGNED:   Burnadette Pop, MD  [facility] 07/22/2022, 11:41 AM Pager [phone]  If 7PM-7AM, please contact night-coverage [email or web address] Password [email or web address]

## 2022-07-22 NOTE — Progress Notes (Signed)
PROGRESS NOTE  Gerald Cruz  ZOX:[id number] DOB: 1950/05/06 DOA: 07/21/2022 PCP: Milda Smart, MD   Brief Narrative: Patient is a 72 year old male with history of  chronic hypoxic respiratory failure 4 L oxygen at baseline,, COPD, hypertension, vitamin B12 deficiency, insulin-dependent diabetes mellitus type 2, hyperlipidemia, GERD, peripheral neuropathy, Parkinson disease, seizure affective disorder and mild cognitive impairment who was brought from nursing facility because of decreased responsiveness, lethargic.  EMS found him with pinpoint pupils, rales, decreased respiratory rate and hypoxic at 69% on 6 L of nasal cannula. Chest x-ray showed bibasilar atelectasis, no focal consolidation, pleural effusion or pneumothorax,stable cardiomegaly.  Apparently patient is DNR and there was plan to transition him to residential hospice from the nursing facility.  This morning ABG showed pCO2 of more than 100, put on BiPAP.  Extensive discussion about goals of care discussed with legal guardian Cassandra Masseendurg and he has been transitioned to comfort care.  Palliative  care also consulted.   Assessment & Plan:  Principal Problem:   Acute hypoxic respiratory failure (HCC) Active Problems:   COPD with acute exacerbation (HCC)   Acute metabolic encephalopathy   GERD (gastroesophageal reflux disease)   Hypertension   Diabetes mellitus (HCC)   Schizoaffective disorder (HCC)   COPD exacerbation (HCC)   Goals of care: Patient was brought from nursing facility because of decreased responsiveness, lethargic.  EMS found him with pinpoint pupils, rales, decreased respiratory rate and hypoxic at 69% on 6 L of nasal cannula. Chest x-ray showed bibasilar atelectasis, no focal consolidation, pleural effusion or pneumothorax,stable cardiomegaly.  Patient is DNR and there was plan to transition him to residential hospice from the nursing facility but it was getting delayed.    Extensive discussion about  goals of care discussed with legal guardian Cassandra Masseendurg .I reviewed his MOST form.  Ms. [name] wants [place] to respect his wishes and requested to be transitioned to comfort care.  She will contact his family. Started on comfort measures.  Palliative care also consulted. Anticipated hospital death versus transfer to residential hospice  Acute hypoxic respiratory failure/COPD/chronic hypoxic respiratory failure on 4 L at baseline: Currently on comfort care  Other medical problems: Hypertension, schizoaffective disorder, Parkinson disease, hyperlipidemia, GERD, type 2 diabetes        DVT prophylaxis:SCDs Start: 07/22/22 0005     Code Status: DNR  Family Communication: Called and discussed with legal guardian  Patient status: Inpatient  Patient is from : SNF  Anticipated discharge to: Residential hospice versus hospital death    Consultants: Palliative care  Procedures:None  Antimicrobials:  Anti-infectives (From admission, onward)    Start     Dose/Rate Route Frequency Ordered Stop   07/22/22 0030  azithromycin (ZITHROMAX) 500 mg in sodium chloride 0.9 % 250 mL IVPB  Status:  Discontinued        500 mg 250 mL/hr over 60 Minutes Intravenous Daily 07/22/22 0007 07/22/22 0849       Subjective: Patient seen and examined the bedside this morning.  He was admitted BiPAP already.  He looks obtunded, did not respond to verbal stimuli, moves his extremities with painful stimuli.  Oxygen saturation was stable on BiPAP.  Very deconditioned  Objective: Vitals:   07/22/22 0745 07/22/22 0753 07/22/22 0755 07/22/22 0801  BP:  136/78    Pulse:  99 99   Resp:  (!) 21 16   Temp: 97.7 F (36.5 C)     TempSrc: Axillary     SpO2:  100% 100% 100%  No intake or output data in the 24 hours ending 07/22/22 0853 There were no vitals filed for this visit.  Examination:  General exam: Obtunded, nonresponsive  HEENT: Eyes closed,bipap Respiratory system: Diminished air  sounds bilaterally  cardiovascular system: S1 & S2 heard, RRR.  Gastrointestinal system: Abdomen is distended, soft and nontender. Central nervous system: Not alert or oriented  extremities: 1-2+ bilateral lower extremity pitting edema, no clubbing ,no cyanosis Skin: No rashes, no ulcers,no icterus     Data Reviewed: I have personally reviewed following labs and imaging studies  CBC: Recent Labs  Lab 07/21/22 2141 07/21/22 2157 07/21/22 2158 07/22/22 0308  WBC 7.0  --   --   --   NEUTROABS 5.5  --   --   --   HGB 10.9* 12.6* 12.6* 12.2*  HCT 39.0 37.0* 37.0* 36.0*  MCV 97.3  --   --   --   PLT 208  --   --   --    Basic Metabolic Panel: Recent Labs  Lab 07/21/22 2141 07/21/22 2157 07/21/22 2158 07/22/22 0308  NA 140 138 138 137  K 5.1 5.1 5.4* 4.7  CL 88*  --  87*  --   CO2 42*  --   --   --   GLUCOSE 203*  --  204*  --   BUN 13  --  20  --   CREATININE 0.50*  --  0.70  --   CALCIUM 8.9  --   --   --      Recent Results (from the past 240 hour(s))  Resp panel by RT-PCR (RSV, Flu A&B, Covid) Anterior Nasal Swab     Status: None   Collection Time: 07/12/22  9:41 PM   Specimen: Anterior Nasal Swab  Result Value Ref Range Status   SARS Coronavirus 2 by RT PCR NEGATIVE NEGATIVE Final    Comment: (NOTE) SARS-CoV-2 target nucleic acids are NOT DETECTED.  The SARS-CoV-2 RNA is generally detectable in upper respiratory specimens during the acute phase of infection. The lowest concentration of SARS-CoV-2 viral copies this assay can detect is 138 copies/mL. A negative result does not preclude SARS-Cov-2 infection and should not be used as the sole basis for treatment or other patient management decisions. A negative result may occur with  improper specimen collection/handling, submission of specimen other than nasopharyngeal swab, presence of viral mutation(s) within the areas targeted by this assay, and inadequate number of viral copies(<138 copies/mL). A negative  result must be combined with clinical observations, patient history, and epidemiological information. The expected result is Negative.  Fact Sheet for Patients:  BloggerCourse.com  Fact Sheet for Healthcare Providers:  SeriousBroker.it  This test is no t yet approved or cleared by the [place] FDA and  has been authorized for detection and/or diagnosis of SARS-CoV-2 by FDA under an Emergency Use Authorization (EUA). This EUA will remain  in effect (meaning this test can be used) for the duration of the COVID-19 declaration under Section 564(b)(1) of the Act, 21 U.S.C.section 360bbb-3(b)(1), unless the authorization is terminated  or revoked sooner.       Influenza A by PCR NEGATIVE NEGATIVE Final   Influenza B by PCR NEGATIVE NEGATIVE Final    Comment: (NOTE) The Xpert Xpress SARS-CoV-2/FLU/RSV plus assay is intended as an aid in the diagnosis of influenza from Nasopharyngeal swab specimens and should not be used as a sole basis for treatment. Nasal washings and aspirates are unacceptable for Xpert Xpress SARS-CoV-2/FLU/RSV testing.  Fact Sheet for Patients: BloggerCourse.com  Fact Sheet for Healthcare Providers: SeriousBroker.it  This test is not yet approved or cleared by the [place] FDA and has been authorized for detection and/or diagnosis of SARS-CoV-2 by FDA under an Emergency Use Authorization (EUA). This EUA will remain in effect (meaning this test can be used) for the duration of the COVID-19 declaration under Section 564(b)(1) of the Act, 21 U.S.C. section 360bbb-3(b)(1), unless the authorization is terminated or revoked.     Resp Syncytial Virus by PCR NEGATIVE NEGATIVE Final    Comment: (NOTE) Fact Sheet for Patients: BloggerCourse.com  Fact Sheet for Healthcare Providers: SeriousBroker.it  This  test is not yet approved or cleared by the [place] FDA and has been authorized for detection and/or diagnosis of SARS-CoV-2 by FDA under an Emergency Use Authorization (EUA). This EUA will remain in effect (meaning this test can be used) for the duration of the COVID-19 declaration under Section 564(b)(1) of the Act, 21 U.S.C. section 360bbb-3(b)(1), unless the authorization is terminated or revoked.  Performed at [facility], [address] [place], [place] [address]   Resp panel by RT-PCR (RSV, Flu A&B, Covid) Anterior Nasal Swab     Status: None   Collection Time: 07/21/22  9:44 PM   Specimen: Anterior Nasal Swab  Result Value Ref Range Status   SARS Coronavirus 2 by RT PCR NEGATIVE NEGATIVE Final   Influenza A by PCR NEGATIVE NEGATIVE Final   Influenza B by PCR NEGATIVE NEGATIVE Final    Comment: (NOTE) The Xpert Xpress SARS-CoV-2/FLU/RSV plus assay is intended as an aid in the diagnosis of influenza from Nasopharyngeal swab specimens and should not be used as a sole basis for treatment. Nasal washings and aspirates are unacceptable for Xpert Xpress SARS-CoV-2/FLU/RSV testing.  Fact Sheet for Patients: BloggerCourse.com  Fact Sheet for Healthcare Providers: SeriousBroker.it  This test is not yet approved or cleared by the [place] FDA and has been authorized for detection and/or diagnosis of SARS-CoV-2 by FDA under an Emergency Use Authorization (EUA). This EUA will remain in effect (meaning this test can be used) for the duration of the COVID-19 declaration under Section 564(b)(1) of the Act, 21 U.S.C. section 360bbb-3(b)(1), unless the authorization is terminated or revoked.     Resp Syncytial Virus by PCR NEGATIVE NEGATIVE Final    Comment: (NOTE) Fact Sheet for Patients: BloggerCourse.com  Fact Sheet for Healthcare  Providers: SeriousBroker.it  This test is not yet approved or cleared by the [place] FDA and has been authorized for detection and/or diagnosis of SARS-CoV-2 by FDA under an Emergency Use Authorization (EUA). This EUA will remain in effect (meaning this test can be used) for the duration of the COVID-19 declaration under Section 564(b)(1) of the Act, 21 U.S.C. section 360bbb-3(b)(1), unless the authorization is terminated or revoked.  Performed at [facility], [address], [place], [place] [address]      Radiology Studies: DG Chest Portable 1 View  Result Date: 07/21/2022 CLINICAL DATA:  Shortness of breath. EXAM: PORTABLE CHEST 1 VIEW COMPARISON:  Chest radiograph dated 07/12/2022. FINDINGS: Bibasilar atelectasis/scarring. No focal consolidation, pleural effusion, or pneumothorax. Stable cardiomegaly. No acute osseous pathology. IMPRESSION: 1. No active disease. 2. Cardiomegaly. Electronically Signed   By: Elgie Collard M.D.   On: 07/21/2022 22:07    Scheduled Meds:  ipratropium  0.5 mg Nebulization Q6H   Continuous Infusions:   LOS: 1 day   Burnadette Pop, MD [facility] P6/25/2024, 8:53  AM  

## 2022-07-22 NOTE — Progress Notes (Signed)
[facility]) [facility] Liaison Note  [name] is a current [name] hospice patient that came into the ED last night from his skilled nursing facility after being found with decreased level of consciousness and shortness of breath. [name] has history of chronic hypoxic respiratory failure. PMT [name] saw patient and after conversation with legal guardian, it was decided to transition patient to full comfort care.  PMT agreed patient may be appropriate for [facility] admission during conversation with hospital liaison.  After speaking with guardian, patient eligibility for [facility] confirmed. Bed available today and guardian agrees with moving forward with transport. DNR in place and transport has been arranged.  RN--please send signed and completed DNR with patient at discharge. Please leave in all IV's for ongoing symptom management needs. Please call report to [phone] prior to patient leaving unit.  Thank you for the opportunity to participate in this patient's care.  Doreatha Martin, RN, BSN [facility] Liaison [phone]

## 2022-07-22 NOTE — Progress Notes (Signed)
RT placed pt back on BiPAP. Pt tolerating well at this time.

## 2022-07-22 NOTE — ED Notes (Signed)
Pt tolerating nonrebreather at this time. Appears uncomfortable, restless, and states "y'all are trying to kill me." RN at bedside to comfort pt, provider notified.

## 2022-07-22 NOTE — Assessment & Plan Note (Deleted)
-   Patient is history of GERD.  As patient is on high doses steroid as well as any-year-old continue IV Protonix.

## 2022-07-22 NOTE — Progress Notes (Signed)
Per MD, RT removed pt from BiPAP and placed pt on 2L [place] due to pt going comfort care.

## 2022-07-22 NOTE — ED Notes (Signed)
Ct delayed at this time, provider suggest waiting until the morning when pt is more mentally stable and able to tolerate/ lay still for CT.

## 2022-07-22 NOTE — ED Notes (Addendum)
PT arrived to room 8 on respiratory distress, Non rebreather mask 15L. Screaming "I can't breath, does not answer any orientation questions". Audible crackles on auscultation, abdominal retractions, and severe JVD. Provider Burnadette Pop MD notified by phone call.

## 2022-07-22 NOTE — ED Notes (Signed)
Pt became less responsive to verbal stimulation, Bipap in place.

## 2022-07-22 NOTE — Consult Note (Signed)
Consultation Note Date: 07/22/2022   Patient Name: Gerald Cruz  DOB: Sep 29, 1950  MRN: [id number]  Age / Sex: 72 y.o., male  PCP: Milda Smart, MD Referring Physician: Burnadette Pop, MD  Reason for Consultation: Establishing goals of care  HPI/Patient Profile: 72 y.o. male  with past medical history of chronic hypoxic respiratory failure 4 L oxygen at baseline, COPD, hypertension, vitamin B12 deficiency, insulin-dependent diabetes mellitus type 2, hyperlipidemia, GERD, peripheral neuropathy, Parkinson disease, schizoaffective disorder, admitted on 07/21/2022 with respiratory distress, lethargy.   Patient was admitted with acute hypoxic respiratory failure secondary to COPD exacerbation. Patient's primary attending has GOC discussion with Cassandra Masseendurg and decision was made to transition to comfort focused care. PMT has been consulted to assist with goals of care conversation and end of life care after the decision was made.  Clinical Assessment and Goals of Care:  I have reviewed medical records including EPIC notes, labs and imaging, received report from RN, assessed the patient and then had a phone conversation with patient's reported legal guardian Cassandra Masseendurg  to discuss diagnosis prognosis, GOC, EOL wishes, disposition and options.  I introduced Palliative Medicine as specialized medical care for people living with serious illness. It focuses on providing relief from the symptoms and stress of a serious illness. The goal is to improve quality of life for both the patient and the family.  We discussed a brief life review of the patient and then focused on their current illness.  The natural disease trajectory and expectations at EOL were discussed.  I attempted to elicit values and goals of care important to the patient.    Medical History Review and Understanding:  Patient's legal  guardian has a good understanding and severity of his illness and that he is approaching end-of-life.  Social History: Patient has a family in [place] including a sister.  Patient's legal guardian states that she will contact family with updates.  He was residing at [facility] prior to admission.  Palliative Symptoms: Dyspnea overnight, breathing comfortably during my visit  Advance Directives: A detailed discussion regarding advanced directives was had.  Patient's MOST form was reviewed.   Code Status: Confirmed patient's wishes for DNR/DNI with legal guardian.  Documentation also on file from 07/11/2022.  Discussion: After my phone conversation with patient's legal guardian, she reviewed her conversation with primary attending and confirmed her decision was to transition patient to comfort care today.  She does not want him to suffer any longer.  We discussed the option of transition to residential hospice, as patient is currently stable and this may not be the case for much longer.  She understands this and wonders if [facility] is an option.  We discussed that this would require changing from with [facility] hospice to hosp[facility] of the [place].  In that case, she would prefer to continue with [facility] hospice given that they are already engaged in patient's care.  Received notification later in the afternoon the patient had become more alert and exhibiting symptoms of paralumbar.  Morphine drip was ordered with adjusted as needed doses every 15 minutes as well.   The difference between aggressive medical intervention and comfort care was considered in light of the patient's goals of care. Hospice and Palliative Care services outpatient were explained and offered.    Questions and concerns were addressed. The family was encouraged to call with questions or concerns.  PMT will continue to support holistically.   SUMMARY OF RECOMMENDATIONS   -Continue DNR/DNI -Signed DNR  form for  patient and provided to hospice liaison -Continue comfort focused care Morphine PRN for pain/air hunger/comfort Robinul PRN for excessive secretions Ativan PRN for agitation/anxiety Zofran PRN for nausea Liquifilm tears PRN for dry eyes Haldol PRN for agitation/anxiety Unrestricted visitations in the setting of EOL (per policy) Oxygen PRN 2L or less for comfort. No escalation.   -[facility] hospice liaison notified of legal guardian's desire for transfer to residential hospice.  Assistance is appreciated -PMT will continue to follow and support  Prognosis:  Hours - Days  Discharge Planning: Hospice facility      Primary Diagnoses: Present on Admission:  Acute hypoxic respiratory failure (HCC)  COPD with acute exacerbation (HCC)  GERD (gastroesophageal reflux disease)  Hypertension  Schizoaffective disorder (HCC)  COPD exacerbation (HCC)   Physical Exam Vitals and nursing note reviewed.  Constitutional:      General: He is not in acute distress.    Appearance: He is ill-appearing.     Interventions: Nasal cannula in place.     Comments: 2L  Cardiovascular:     Rate and Rhythm: Normal rate.  Pulmonary:     Effort: Pulmonary effort is normal. No respiratory distress.  Neurological:     Mental Status: He is unresponsive.    Vital Signs: BP 136/78   Pulse 99   Temp 97.7 F (36.5 C) (Axillary)   Resp 16   SpO2 100%        SpO2: SpO2: 100 % O2 Device:SpO2: 100 % O2 Flow Rate: .    Palliative Assessment/Data: 10%    MDM: high   Keleigh Kazee Jeni Salles, PA-C  Palliative Medicine Team Team phone # [phone]  Thank you for allowing the Palliative Medicine Team to assist in the care of this patient. Please utilize secure chat with additional questions, if there is no response within 30 minutes please call the above phone number.  Palliative Medicine Team providers are available by phone from 7am to 7pm daily and can be reached through the team cell phone.  Should  this patient require assistance outside of these hours, please call the patient's attending physician.
